# Patient Record
Sex: Female | Born: 1985 | Race: White | Hispanic: No | Marital: Married | Smoking: Never smoker
Health system: Southern US, Community
[De-identification: ages and names within clinical notes are randomized; demographics above are authoritative.]

## PROBLEM LIST (undated history)

## (undated) ENCOUNTER — Inpatient Hospital Stay (HOSPITAL_COMMUNITY): Payer: Self-pay

## (undated) DIAGNOSIS — Z8619 Personal history of other infectious and parasitic diseases: Secondary | ICD-10-CM

## (undated) DIAGNOSIS — O24419 Gestational diabetes mellitus in pregnancy, unspecified control: Secondary | ICD-10-CM

## (undated) HISTORY — DX: Personal history of other infectious and parasitic diseases: Z86.19

## (undated) HISTORY — PX: WISDOM TOOTH EXTRACTION: SHX21

## (undated) HISTORY — DX: Gestational diabetes mellitus in pregnancy, unspecified control: O24.419

---
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
## 2003-05-01 ENCOUNTER — Other Ambulatory Visit: Admission: RE | Admit: 2003-05-01 | Discharge: 2003-05-01 | Payer: Self-pay | Admitting: Gynecology

## 2009-05-02 ENCOUNTER — Emergency Department (HOSPITAL_BASED_OUTPATIENT_CLINIC_OR_DEPARTMENT_OTHER): Admission: EM | Admit: 2009-05-02 | Discharge: 2009-05-02 | Payer: Self-pay | Admitting: Emergency Medicine

## 2010-09-12 ENCOUNTER — Encounter: Payer: BC Managed Care – PPO | Attending: Gynecology | Admitting: Dietician

## 2010-09-12 DIAGNOSIS — Z713 Dietary counseling and surveillance: Secondary | ICD-10-CM | POA: Insufficient documentation

## 2010-09-12 DIAGNOSIS — O99810 Abnormal glucose complicating pregnancy: Secondary | ICD-10-CM | POA: Insufficient documentation

## 2010-11-08 ENCOUNTER — Inpatient Hospital Stay (HOSPITAL_COMMUNITY)
Admission: AD | Admit: 2010-11-08 | Discharge: 2010-11-10 | DRG: 372 | Disposition: A | Discharge status: Home / Self Care | Payer: BC Managed Care – PPO | Source: Ambulatory Visit | Attending: Obstetrics | Admitting: Obstetrics

## 2010-11-08 DIAGNOSIS — O99814 Abnormal glucose complicating childbirth: Secondary | ICD-10-CM | POA: Diagnosis present

## 2010-11-09 LAB — RPR: RPR Ser Ql: NONREACTIVE

## 2010-11-09 LAB — CBC
HCT: 36.4 % (ref 36.0–46.0)
Hemoglobin: 11.6 g/dL — ABNORMAL LOW (ref 12.0–15.0)
MCH: 27.3 pg (ref 26.0–34.0)
MCHC: 31.9 g/dL (ref 30.0–36.0)
MCV: 85.6 fL (ref 78.0–100.0)
Platelets: 225 10*3/uL (ref 150–400)
RBC: 4.25 MIL/uL (ref 3.87–5.11)
RDW: 16.3 % — ABNORMAL HIGH (ref 11.5–15.5)
WBC: 14 10*3/uL — ABNORMAL HIGH (ref 4.0–10.5)

## 2010-11-09 LAB — GLUCOSE, RANDOM: Glucose, Bld: 121 mg/dL — ABNORMAL HIGH (ref 70–99)

## 2010-11-10 LAB — CBC
HCT: 33.6 % — ABNORMAL LOW (ref 36.0–46.0)
Hemoglobin: 10.4 g/dL — ABNORMAL LOW (ref 12.0–15.0)
MCH: 26.6 pg (ref 26.0–34.0)
MCHC: 31 g/dL (ref 30.0–36.0)
MCV: 85.9 fL (ref 78.0–100.0)
Platelets: 234 10*3/uL (ref 150–400)
RBC: 3.91 MIL/uL (ref 3.87–5.11)
RDW: 16.4 % — ABNORMAL HIGH (ref 11.5–15.5)
WBC: 11.4 10*3/uL — ABNORMAL HIGH (ref 4.0–10.5)

## 2012-06-16 NOTE — L&D Delivery Note (Signed)
Delivery Note  First Stage: Labor onset: 0600 Augmentation : Foley bulb & AROM Analgesia /Anesthesia intrapartum: none; hydrotherapy AROM at 0652  Second Stage: In waterbirth tub: [phone]; water temp: 99.0 Complete dilation at 1012 Onset of pushing at 1012 FHR second stage 135  Delivery of a viable female at 1020 in water birth tub by CNM in LOT position NO nuchal cord Cord double clamped after cessation of pulsation, cut by FOB Cord blood sample collected    Third Stage: Out of waterbirth tub: 1025 Placenta delivered via spontaneous Schultz intact with 3 VC @ 1031 Placenta disposition: Birthing suites Uterine tone firm / bleeding moderate  2nd degree perineal laceration identified  Anesthesia for repair: Lidocaine Repair 3.0 vicryl, 4.0 vicryl Est. Blood Loss (mL): 450  Complications: none  Mom to postpartum.  Baby to immediate skin-to-skin bonding on mom's chest and then to FOB's chest for skin-to-skin bonding While transferring patient to bed for placental delivery  Newborn: Birth Weight: pending  Apgar Scores: 8/8 Feeding planned: breast; infant breastfeeding during perineal repair  *Dr. Cherly Hensen notified of uncomplicated waterbirth and perineal repair   [name], M  MSN, CNM 04/24/2013, 11:31 AM

## 2012-09-27 LAB — OB RESULTS CONSOLE ABO/RH: RH Type: POSITIVE

## 2012-09-27 LAB — OB RESULTS CONSOLE HIV ANTIBODY (ROUTINE TESTING): HIV: NONREACTIVE

## 2012-09-27 LAB — OB RESULTS CONSOLE RPR: RPR: NONREACTIVE

## 2012-09-27 LAB — OB RESULTS CONSOLE RUBELLA ANTIBODY, IGM: Rubella: IMMUNE

## 2013-03-24 LAB — OB RESULTS CONSOLE GBS: GBS: NEGATIVE

## 2013-04-20 ENCOUNTER — Telehealth (HOSPITAL_COMMUNITY): Payer: Self-pay | Admitting: *Deleted

## 2013-04-20 ENCOUNTER — Encounter (HOSPITAL_COMMUNITY): Payer: Self-pay | Admitting: *Deleted

## 2013-04-20 NOTE — Telephone Encounter (Signed)
Preadmission screen  

## 2013-04-23 ENCOUNTER — Encounter (HOSPITAL_COMMUNITY): Payer: Self-pay

## 2013-04-23 ENCOUNTER — Inpatient Hospital Stay (HOSPITAL_COMMUNITY)
Admission: RE | Admit: 2013-04-23 | Discharge: 2013-04-25 | DRG: 775 | Disposition: A | Discharge status: Home / Self Care | Payer: BC Managed Care – PPO | Source: Ambulatory Visit | Attending: Obstetrics and Gynecology | Admitting: Obstetrics and Gynecology

## 2013-04-23 DIAGNOSIS — O3660X0 Maternal care for excessive fetal growth, unspecified trimester, not applicable or unspecified: Secondary | ICD-10-CM | POA: Diagnosis present

## 2013-04-23 DIAGNOSIS — O99814 Abnormal glucose complicating childbirth: Principal | ICD-10-CM | POA: Diagnosis present

## 2013-04-23 LAB — CBC
HCT: 34.1 % — ABNORMAL LOW (ref 36.0–46.0)
MCH: 26 pg (ref 26.0–34.0)
MCV: 82 fL (ref 78.0–100.0)
Platelets: 272 10*3/uL (ref 150–400)
RDW: 15.9 % — ABNORMAL HIGH (ref 11.5–15.5)

## 2013-04-23 MED ORDER — IBUPROFEN 600 MG PO TABS: 600.0000 mg | ORAL_TABLET | Freq: Four times a day (QID) | ORAL | Status: DC | PRN

## 2013-04-23 MED ORDER — OXYCODONE-ACETAMINOPHEN 5-325 MG PO TABS: 1.0000 | ORAL_TABLET | ORAL | Status: DC | PRN

## 2013-04-23 MED ORDER — CITRIC ACID-SODIUM CITRATE 334-500 MG/5ML PO SOLN: 30.0000 mL | ORAL | Status: DC | PRN

## 2013-04-23 MED ORDER — OXYTOCIN 10 UNIT/ML IJ SOLN: 10.0000 [IU] | Freq: Once | INTRAMUSCULAR | Status: AC | PRN

## 2013-04-23 MED ORDER — ACETAMINOPHEN 325 MG PO TABS: 650.0000 mg | ORAL_TABLET | ORAL | Status: DC | PRN

## 2013-04-23 MED ORDER — LACTATED RINGERS IV SOLN: 500.0000 mL | INTRAVENOUS | Status: DC | PRN

## 2013-04-23 MED ORDER — LIDOCAINE HCL (PF) 1 % IJ SOLN
30.0000 mL | INTRAMUSCULAR | Status: DC | PRN
  Administered 2013-04-24: 30 mL via SUBCUTANEOUS
  Filled 2013-04-23 (×2): qty 30

## 2013-04-23 NOTE — H&P (Signed)
OB ADMISSION/ HISTORY & PHYSICAL:   Admission Date: 04/23/2013  7:25 PM  Admit Diagnosis: INDUCTION OF LABOR DUE TO GDM -A2 and low normal amniotic fluid  Bonnie Roth is a 27 y.o. female presenting for IOL @ 39.[redacted]wks gestation.  Prenatal History: [id number]   EDC : 04/26/2013, by Other Basis  Prenatal care at [facility] since 9.[redacted] weeks gestation Primary Care Provider at [facility]: Marlinda Mike, CNM  Prenatal course complicated by GDM  Prenatal Labs: ABO, Rh: A (04/14 0000)  Antibody: Negative (04/14 0000) Rubella: Immune (04/14 0000)  RPR: Nonreactive (04/14 0000)  HBsAg: Negative (04/14 0000)  HIV: Non-reactive (04/14 0000)  GBS: Negative (10/09 0000)  1 hr Glucola : omitted d/t HgB A1C: 5.7 mg/dL   Medical / Surgical History :  Past medical history:  Past Medical History  Diagnosis Date  . Gestational diabetes   . Hx of varicella      Past surgical history:  Past Surgical History  Procedure Laterality Date  . Arthroscopic knee surgery  2001    Left  . Wisdom tooth extraction       Family History:  Family History  Problem Relation Age of Onset  . Hypertension Mother   . Depression Mother   . Anemia Mother   . Bipolar disorder Brother   . Depression Brother   . Depression Maternal Grandmother   . Thyroid disease Paternal Grandmother      Social History:  has no tobacco, alcohol, and drug history on file.   Allergies: Review of patient's allergies indicates no known allergies.    Current Medications at time of admission:  Prescriptions prior to admission  Medication Sig Dispense Refill  . acetaminophen (TYLENOL) 325 MG tablet Take 650 mg by mouth every 6 (six) hours as needed.      . metFORMIN (GLUCOPHAGE) 1000 MG tablet Take 1,500 mg by mouth daily after supper. 500mg  daily after breakfast      . OVER THE COUNTER MEDICATION Take 1 tablet by mouth as needed (blue cohosh to help with labor).          Review of Systems: Review  of Systems  Constitutional: Negative.   HENT: Negative.   Eyes: Negative.   Respiratory: Negative.   Cardiovascular: Negative.   Gastrointestinal: Negative.   Genitourinary: Negative.   Musculoskeletal: Negative.   Skin: Negative.   Neurological: Negative.   Endo/Heme/Allergies: Negative.   Psychiatric/Behavioral: Negative.   (+) FM  Physical Exam:  Dilation: 2.5 Effacement (%): 80 Station: -2 Exam by:: R. Alder Murri,CNM  Filed Vitals:   04/23/13 2313  BP: 119/65  Pulse: 97  Temp: 98.7 F (37.1 C)  Resp: 20    General: A&O x3, NAD Heart: RRR, no murmurs Lungs: CTAB Abdomen: gravid, soft, non-tender Extremities: no swelling Genitalia / VE: normal genitalia, 2.5/80/-2/vtx FHR: 145, moderate variability, accels present, decels absent TOCO: 2-3  Labs:     Recent Labs  04/23/13 2000  WBC 12.1*  HGB 10.8*  HCT 34.1*  PLT 272     Assessment:  27 y.o. [id number] at [redacted]w[redacted]d  1. Labor: Early 2. Fetal Wellbeing: Category 1  3. Pain Control:  4. GBS: negative   Plan:  1. Admit to BS 2. Routine L&D orders 3. Analgesia/anesthesia PRN     Dr Cherly Hensen notified of admission / plan of care    Kenard Gower, MSN, CNM 04/23/2013, 11:02 PM

## 2013-04-23 NOTE — Progress Notes (Addendum)
Patient ID: Bonnie Roth, female   DOB: 08-11-1985, 27 y.o.   MRN: [id number] S: Doing well, pain minimal and well-controlled with frequent position changes and use of birthing ball. (+) pelvic pressure.   O: Filed Vitals:   04/23/13 1955 04/23/13 2004 04/23/13 2006  BP: 129/81    Pulse: 92    Temp:   98.1 F (36.7 C)  TempSrc:   Oral  Resp: 18    Height:  5\' 9"  (1.753 m)   Weight:  108.863 kg (240 lb)      FHT:  FHR: 145 bpm, variability: moderate,  accelerations:  Present,  decelerations:  Absent - intermittent  UC:   irregular, every 3-5 minutes by palpation SVE:   Dilation: 2.5 Effacement (%): 80 Station: -2 Exam by:: R. Loreda Silverio,CNM   A / P: Induction of labor due to gestational diabetes class A2 and low normal AFI,  progressing well on pitocin GDM A2 - stable on Metformin H/O LGA babies (EFW 8'9")  Fetal Wellbeing:  Category I Pain Control:  Labor support without medications  Anticipated MOD:  NSVD  Kenard Gower, MSN, CNM 04/23/2013, 10:59 PM

## 2013-04-23 NOTE — Progress Notes (Addendum)
Patient ID: Bonnie Roth, female   DOB: [birth year], 27 y.o.   MRN: [id number] Bonnie Roth is a 27 y.o. [id number] at [redacted]w[redacted]d by ultrasound admitted for induction of labor due to Low amniotic fluid. and Gestational diabetes.  Subjective:  Doing well with no complaints of pain.  Diffusing essential oils as labor adjunct.  Requesting to labor outside  Of tub and have birth take place in birthing tub.  Husband and sister here as support persons - husband will be the Nature conservation officer.   Objective: Filed Vitals:   04/23/13 1955 04/23/13 2004 04/23/13 2006  BP: 129/81    Pulse: 92    Temp:   98.1 F (36.7 C)  TempSrc:   Oral  Resp: 18    Height:  5\' 9"  (1.753 m)   Weight:  108.863 kg (240 lb)           FHT:  FHR: 145 bpm, variability: moderate,  accelerations:  Present,  decelerations:  Absent UC:   regular, every 2-3 minutes SVE:   Dilation: 2.5 Effacement (%): 80 Station: -2 Exam by:: R. Raffaele Derise,CNM  Labs:   Recent Labs  04/23/13 2000  WBC 12.1*  HGB 10.8*  HCT 34.1*  PLT 272    Assessment / Plan: Foley Bulb Induction of labor due to gestational diabetes class A2 and low normal AFI GDM A2 - stable on Metformin H/O LGA babies (EFW this pregnancy 8'9")  Labor: Early Labor Fetal Wellbeing:  Category I Pain Control:  Labor support without medications - with plans to use various positions for labor Anticipated MOD:  NSVD - anticipating waterbirth  * Dr. Cherly Hensen notified of plan of care - agrees with plan  Kenard Gower, MSN, CNM 04/23/2013, 8:35 PM

## 2013-04-24 ENCOUNTER — Encounter (HOSPITAL_COMMUNITY): Payer: Self-pay

## 2013-04-24 LAB — ABO/RH: ABO/RH(D): A POS

## 2013-04-24 LAB — TYPE AND SCREEN
ABO/RH(D): A POS
Antibody Screen: NEGATIVE

## 2013-04-24 LAB — GLUCOSE, CAPILLARY
Glucose-Capillary: 107 mg/dL — ABNORMAL HIGH (ref 70–99)
Glucose-Capillary: 110 mg/dL — ABNORMAL HIGH (ref 70–99)
Glucose-Capillary: 92 mg/dL (ref 70–99)

## 2013-04-24 MED ORDER — ONDANSETRON HCL 4 MG PO TABS: 4.0000 mg | ORAL_TABLET | ORAL | Status: DC | PRN

## 2013-04-24 MED ORDER — OXYTOCIN 10 UNIT/ML IJ SOLN
40.0000 [IU] | INTRAVENOUS | Status: DC
  Filled 2013-04-24: qty 4

## 2013-04-24 MED ORDER — TETANUS-DIPHTH-ACELL PERTUSSIS 5-2.5-18.5 LF-MCG/0.5 IM SUSP: 0.5000 mL | Freq: Once | INTRAMUSCULAR | Status: DC

## 2013-04-24 MED ORDER — WITCH HAZEL-GLYCERIN EX PADS: 1.0000 "application " | MEDICATED_PAD | CUTANEOUS | Status: DC | PRN

## 2013-04-24 MED ORDER — ONDANSETRON HCL 4 MG/2ML IJ SOLN: 4.0000 mg | INTRAMUSCULAR | Status: DC | PRN

## 2013-04-24 MED ORDER — DIPHENHYDRAMINE HCL 25 MG PO CAPS: 25.0000 mg | ORAL_CAPSULE | Freq: Four times a day (QID) | ORAL | Status: DC | PRN

## 2013-04-24 MED ORDER — DIBUCAINE 1 % RE OINT: 1.0000 "application " | TOPICAL_OINTMENT | RECTAL | Status: DC | PRN

## 2013-04-24 MED ORDER — SENNOSIDES-DOCUSATE SODIUM 8.6-50 MG PO TABS
2.0000 | ORAL_TABLET | ORAL | Status: DC
  Administered 2013-04-25: 2 via ORAL
  Filled 2013-04-24: qty 2

## 2013-04-24 MED ORDER — PRENATAL MULTIVITAMIN CH
1.0000 | ORAL_TABLET | Freq: Every day | ORAL | Status: DC
  Administered 2013-04-25: 1 via ORAL
  Filled 2013-04-24 (×2): qty 1

## 2013-04-24 MED ORDER — OXYTOCIN 40 UNITS IN LACTATED RINGERS INFUSION - SIMPLE MED
INTRAVENOUS | Status: AC
  Administered 2013-04-24: 40 [IU]
  Filled 2013-04-24: qty 1000

## 2013-04-24 MED ORDER — IBUPROFEN 600 MG PO TABS
600.0000 mg | ORAL_TABLET | Freq: Four times a day (QID) | ORAL | Status: DC
  Administered 2013-04-24 – 2013-04-25 (×4): 600 mg via ORAL
  Filled 2013-04-24 (×4): qty 1

## 2013-04-24 MED ORDER — OXYCODONE-ACETAMINOPHEN 5-325 MG PO TABS
1.0000 | ORAL_TABLET | ORAL | Status: DC | PRN
  Administered 2013-04-24 (×2): 1 via ORAL
  Filled 2013-04-24 (×2): qty 1

## 2013-04-24 MED ORDER — BENZOCAINE-MENTHOL 20-0.5 % EX AERO: 1.0000 "application " | INHALATION_SPRAY | CUTANEOUS | Status: DC | PRN

## 2013-04-24 MED ORDER — SIMETHICONE 80 MG PO CHEW: 80.0000 mg | CHEWABLE_TABLET | ORAL | Status: DC | PRN

## 2013-04-24 NOTE — Lactation Note (Signed)
This note was copied from the chart of Bonnie Terrianne Vink. Lactation Consultation Note Initial visit at 10 hours of age. [facility] LC resources given and discussed.  Mom reports knowing how to hand express.  Encouraged skin to skin feeding with cues. Basics reviewed.  Mom has previous breast feeding experience with her last child for 20 months. Mom denies problems or pain and reports breast feeding is going well with swallows heard.  Talked with parents about future pumping.  Mom to call for assist as needed.   Patient Name: Bonnie Roth [id number] Date: 04/24/2013 Reason for consult: Initial assessment   Maternal Data Formula Feeding for Exclusion: No Infant to breast within first hour of birth: Yes Has patient been taught Hand Expression?: Yes Does the patient have breastfeeding experience prior to this delivery?: Yes  Feeding Feeding Type: Breast Fed Length of feed: 15 min  LATCH Score/Interventions                      Lactation Tools Discussed/Used     Consult Status Consult Status: PRN    Jannifer Rodney 04/24/2013, 10:08 PM

## 2013-04-24 NOTE — Progress Notes (Signed)
Foley bulb out, monitors off per orders

## 2013-04-24 NOTE — Progress Notes (Signed)
Patient ID: Bonnie Roth, female   DOB: 1986/06/01, 27 y.o.   MRN: [id number]  S: Doing well, pain increasing and contractions closer together.  In water tub.  (+) pelvic pressure with every contraction.   OCeasar Mons Vitals:   04/24/13 0806 04/24/13 0904 04/24/13 1053 04/24/13 1103  BP:   117/69 131/72  Pulse:   110 117  Temp:      TempSrc:      Resp: 18 20    Height:      Weight:         FHT:  140, moderate variability, 10x10 accels, no decels UC:   regular, every 3-5 minutes SVE:   Dilation: 9 Effacement (%): 100 Station: +1 Exam by:: Arita Miss, CNM Time In tub/temp: 0835/100 degrees  A / P: Induction of labor due to gestational diabetes and low normal AFI,  progressing well  Fetal Wellbeing:  Category I Pain Control:  Labor support without medications and essential oils and hydrotherapy  Anticipated MOD:  NSVD - waterbirth  Kenard Gower, MSN, CNM 04/24/2013, 9:00 AM

## 2013-04-24 NOTE — Progress Notes (Signed)
Patient ID: Bonnie Roth, female   DOB: [birth year], 27 y.o.   MRN: [id number] S: Doing well, contractions were spaced out and now getting closer together, but tolerable.  OCeasar Mons Vitals:   04/23/13 2004 04/23/13 2006 04/23/13 2313 04/24/13 0200  BP:   119/65 127/80  Pulse:   97 103  Temp:  98.1 F (36.7 C) 98.7 F (37.1 C)   TempSrc:  Oral Oral   Resp:   20   Height: 5\' 9"  (1.753 m)     Weight: 108.863 kg (240 lb)        FHT:  FHR: 130 bpm, variability: moderate,  accelerations:  Present,  decelerations:  Absent UC:   irregular, every 7-10 minutes SVE:   Dilation: 5 Effacement (%): 80 Station: -1 Exam by:: R. Lorrine Killilea,CNM   A / P: Induction of labor due to gestational diabetes class A2 and low normal AFI GDM A2 - stable on Metformin H/O LGA babies (EFW 8'9")  Fetal Wellbeing:  Category I Pain Control:  Labor support without medications, aromatherapy  Anticipated MOD:  NSVD - planning a waterbirth  *Dr. Cherly Hensen notified of patient's progress and plan of care   Abdulkarim Eberlin, M, MSN, CNM 04/24/2013, 4:35 AM

## 2013-04-24 NOTE — Progress Notes (Signed)
Monitors off per orders 

## 2013-04-24 NOTE — Progress Notes (Signed)
Patient ID: Bonnie Roth, female   DOB: 09-22-1985, 27 y.o.   MRN: [id number]  Notified by RN of foley bulb came out and UC's are 3-5 mins  S: Doing well, pain well-controlled with breathing techniques, birthing ball, and aromatherapy. (+) pelvic pressure.   O: Filed Vitals:   04/23/13 1955 04/23/13 2004 04/23/13 2006 04/23/13 2313  BP: 129/81   119/65  Pulse: 92   97  Temp:   98.1 F (36.7 C) 98.7 F (37.1 C)  TempSrc:   Oral Oral  Resp: 18   20  Height:  5\' 9"  (1.753 m)    Weight:  108.863 kg (240 lb)       FHT:  FHR: 130 bpm, variability: moderate,  accelerations:  Present,  decelerations:  Absent UC:   irregular, every 3-5 minutes SVE:   Dilation: 5 Effacement (%): 80 Station: -1 Exam by:: R. Danaya Geddis,CNM   A / P: Induction of labor due to gestational diabetes and low normal AFI,  progressing well on with foley bulb GDM A2 - stable on Metformin H/O LGA babies (EFW this pregnancy 8'9")  Offer AROM for labor augmentation - declines at this time, would like to try other measures Fetal Wellbeing:  Category I Pain Control:  Labor support without medications  Anticipated MOD:  NSVD  Kenard Gower, MSN, CNM 04/24/2013, 12:56 AM

## 2013-04-24 NOTE — Progress Notes (Signed)
Patient ID: Bonnie Roth, female   DOB: 27-Mar-1986, 27 y.o.   MRN: [id number]  Call from RN stating patient is requesting to be checked again and she is considering AROM.  S: Doing well, pain tolerable, contractions getting closer.   OCeasar Mons Vitals:   04/23/13 2006 04/23/13 2313 04/24/13 0200 04/24/13 0540  BP:  119/65 127/80 124/72  Pulse:  97 103 82  Temp: 98.1 F (36.7 C) 98.7 F (37.1 C)  97.8 F (36.6 C)  TempSrc: Oral Oral  Oral  Resp:  20  18  Height:      Weight:         FHT:  FHR: 140 bpm, variability: moderate,  accelerations:  Present,  decelerations:  Absent UC:   regular, every 4-5 minutes SVE:   Dilation: 6 Effacement (%): 80 Station: -1 Exam by:: R. Kammi Hechler,CNM AROM with small amount of clear fluid  A / P: Induction of labor due to gestational diabetes and low normal AFI AROM GDM class A2- stable on Metformin H/O LGA babies (EFW 8'9")  Fetal Wellbeing:  Category I Pain Control:  Labor support without medications  Anticipated MOD:  NSVD, planning waterbirth  [name], M, MSN, CNM 04/24/2013, 7:00 AM

## 2013-04-25 LAB — CBC
HCT: 27.7 % — ABNORMAL LOW (ref 36.0–46.0)
Hemoglobin: 8.9 g/dL — ABNORMAL LOW (ref 12.0–15.0)
MCHC: 32.1 g/dL (ref 30.0–36.0)
MCV: 81 fL (ref 78.0–100.0)
Platelets: 245 10*3/uL (ref 150–400)
RDW: 15.7 % — ABNORMAL HIGH (ref 11.5–15.5)
WBC: 12.2 10*3/uL — ABNORMAL HIGH (ref 4.0–10.5)

## 2013-04-25 MED ORDER — POLYSACCHARIDE IRON COMPLEX 150 MG PO CAPS: 150.0000 mg | ORAL_CAPSULE | Freq: Every day | ORAL | Status: DC

## 2013-04-25 MED ORDER — OXYCODONE-ACETAMINOPHEN 5-325 MG PO TABS: 1.0000 | ORAL_TABLET | Freq: Four times a day (QID) | ORAL | Status: DC | PRN

## 2013-04-25 MED ORDER — DOCUSATE SODIUM 100 MG PO CAPS
100.0000 mg | ORAL_CAPSULE | Freq: Two times a day (BID) | ORAL | Status: AC | PRN
Start: 2013-04-25 — End: 2014-04-25

## 2013-04-25 MED ORDER — IBUPROFEN 600 MG PO TABS: 600.0000 mg | ORAL_TABLET | Freq: Four times a day (QID) | ORAL | Status: DC

## 2013-04-25 MED ORDER — POLYSACCHARIDE IRON COMPLEX 150 MG PO CAPS
150.0000 mg | ORAL_CAPSULE | Freq: Every day | ORAL | Status: DC
  Filled 2013-04-25: qty 1

## 2013-04-25 NOTE — Progress Notes (Addendum)
Patient ID: Bonnie Roth, female   DOB: 10-30-1985, 27 y.o.   MRN: [id number] PPD # 1  Subjective: Pt reports feeling well and eager for early d/c home/ Pain controlled with ibuprofen Tolerating po/ Voiding without problems/ No n/v Bleeding is light/ Newborn info:  Information for the patient's newborn:  Antonea, Gaut [[id number]]  female  / circ not planned/ Feeding: breast    Objective:  VS: Blood pressure 104/60, pulse 87, temperature 98.5 F (36.9 C), temperature source Oral, resp. rate 16.    Recent Labs  04/23/13 2000 04/25/13 0555  WBC 12.1* 12.2*  HGB 10.8* 8.9*  HCT 34.1* 27.7*  PLT 272 245    Blood type:  A POS  Rubella: Immune (04/14 0000)    Physical Exam:  General:  alert, cooperative and no distress CV: Regular rate and rhythm Resp: clear Abdomen: soft, nontender, normal bowel sounds Uterine Fundus: firm, below umbilicus, nontender Perineum: healing with good reapproximation; small amt edema; no evidence of hematoma Lochia: moderate Ext: edema trace and Homans sign is negative, no sign of DVT    A/P: PPD # 1/ G4P3013/ S/P: SVD w/2nd deg lac with repair ABL Anemia Hx GDM (A2); stable; urged to cont low carb diet and monitor occasional FBS Doing well and stable for discharge home RX: Ibuprofen 600mg  po Q 6 hrs prn pain #30 Refill x 1 Niferex 150mg  po QD/BID #30/#60 Refill x 1 Percocet 5/325 1 to 2 po Q 4 hrs prn pain #15 No refill Colace 100mg  po BID prn #30 Ref x 1 WOB/GYN booklet given Routine pp visit in 6wks   Demetrius Revel, MSN, [place] 04/25/2013, 10:12 AM

## 2013-04-27 NOTE — Discharge Summary (Signed)
Obstetric Discharge Summary Reason for Admission: G4 P2 0 1 2 @ 39w 4d for IOL d/t GDM (A2) Prenatal Procedures: NST and ultrasound Intrapartum Procedures: spontaneous vaginal delivery Postpartum Procedures: none Complications-Operative and Postpartum: 2nd degree perineal laceration Hemoglobin  Date Value Range Status  04/25/2013 8.9* 12.0 - 15.0 g/dL Final     REPEATED TO VERIFY     DELTA CHECK NOTED     HCT  Date Value Range Status  04/25/2013 27.7* 36.0 - 46.0 % Final    Physical Exam:  General: alert, cooperative and no distress Lochia: appropriate Uterine Fundus: firm Incision: n/a DVT Evaluation: No evidence of DVT seen on physical exam. Negative Homan's sign.  Discharge Diagnoses: G4 P3 0 1 3 s/p SVD with 2nd lac with repair; ABL Anemia, CLass A2 GDM  Discharge Information: Date: 04/27/2013 Activity: pelvic rest Diet: routine Medications: PNV, Ibuprofen, Colace, Iron and Percocet Condition: stable Instructions: refer to practice specific booklet Discharge to: home Follow-up Information   Follow up with DAWSON, ROLITTA, M, CNM In 6 weeks.   Specialty:  Obstetrics and Gynecology   Contact information:   [address] [place] [address] [phone]       Follow up with [name], M, CNM In 6 weeks.   Specialty:  Obstetrics and Gynecology   Contact information:   [address] [place] [address] [phone]       Newborn Data: Live born female on 04/24/13 Birth Weight: 10 lb (4535 g) APGAR: 8, 8  Home with mother.  Demetrius Revel, MSN, [facility] 04/25/13 12:00

## 2014-04-17 ENCOUNTER — Encounter (HOSPITAL_COMMUNITY): Payer: Self-pay

## 2015-02-08 ENCOUNTER — Encounter (HOSPITAL_COMMUNITY): Payer: Self-pay | Admitting: *Deleted

## 2015-02-08 ENCOUNTER — Inpatient Hospital Stay (HOSPITAL_COMMUNITY)
Admission: AD | Admit: 2015-02-08 | Discharge: 2015-02-08 | Disposition: A | Discharge status: Home / Self Care | Payer: BLUE CROSS/BLUE SHIELD | Source: Ambulatory Visit | Attending: Family Medicine | Admitting: Family Medicine

## 2015-02-08 DIAGNOSIS — Z3A18 18 weeks gestation of pregnancy: Secondary | ICD-10-CM | POA: Diagnosis not present

## 2015-02-08 DIAGNOSIS — Z8249 Family history of ischemic heart disease and other diseases of the circulatory system: Secondary | ICD-10-CM | POA: Diagnosis not present

## 2015-02-08 DIAGNOSIS — O9989 Other specified diseases and conditions complicating pregnancy, childbirth and the puerperium: Secondary | ICD-10-CM | POA: Insufficient documentation

## 2015-02-08 DIAGNOSIS — R197 Diarrhea, unspecified: Secondary | ICD-10-CM | POA: Insufficient documentation

## 2015-02-08 LAB — URINALYSIS, ROUTINE W REFLEX MICROSCOPIC
BILIRUBIN URINE: NEGATIVE
Glucose, UA: NEGATIVE mg/dL
KETONES UR: 15 mg/dL — AB
NITRITE: NEGATIVE
PROTEIN: NEGATIVE mg/dL
Specific Gravity, Urine: 1.015 (ref 1.005–1.030)
Urobilinogen, UA: 2 mg/dL — ABNORMAL HIGH (ref 0.0–1.0)
pH: 6 (ref 5.0–8.0)

## 2015-02-08 LAB — CBC WITH DIFFERENTIAL/PLATELET
BASOS ABS: 0 10*3/uL (ref 0.0–0.1)
BASOS PCT: 0 % (ref 0–1)
Eosinophils Absolute: 0.1 10*3/uL (ref 0.0–0.7)
Eosinophils Relative: 2 % (ref 0–5)
HCT: 38.6 % (ref 36.0–46.0)
HEMOGLOBIN: 13 g/dL (ref 12.0–15.0)
Lymphocytes Relative: 20 % (ref 12–46)
Lymphs Abs: 1 10*3/uL (ref 0.7–4.0)
MCH: 29 pg (ref 26.0–34.0)
MCHC: 33.7 g/dL (ref 30.0–36.0)
MCV: 86 fL (ref 78.0–100.0)
Monocytes Absolute: 0.4 10*3/uL (ref 0.1–1.0)
Monocytes Relative: 7 % (ref 3–12)
NEUTROS ABS: 3.7 10*3/uL (ref 1.7–7.7)
NEUTROS PCT: 71 % (ref 43–77)
Platelets: 214 10*3/uL (ref 150–400)
RBC: 4.49 MIL/uL (ref 3.87–5.11)
RDW: 14.8 % (ref 11.5–15.5)
WBC: 5.2 10*3/uL (ref 4.0–10.5)

## 2015-02-08 LAB — URINE MICROSCOPIC-ADD ON

## 2015-02-08 MED ORDER — LACTATED RINGERS IV BOLUS (SEPSIS)
1000.0000 mL | Freq: Once | INTRAVENOUS | Status: AC
  Administered 2015-02-08: 1000 mL via INTRAVENOUS

## 2015-02-08 NOTE — Discharge Instructions (Signed)

## 2015-02-08 NOTE — MAU Note (Signed)
Onset of diarrhea, N/V abdominal pain since Sunday, 6 episodes today.

## 2015-02-08 NOTE — MAU Provider Note (Signed)
History     CSN: [id number]  Arrival date and time: 02/08/15 1225   First Provider Initiated Contact with Patient 02/08/15 1301      Chief Complaint  Patient presents with  . Diarrhea  . Emesis  . Abdominal Pain  . Fever   HPI Bonnie Roth 29 y.o. [id number]  presents to MAU.  Diarrhea Sunday night (8/21).  Monday with nausea/vomiting.  Tuesday sweating/shaking/shivering.  Still sweating.  Still with diarrhea constantly.  Was told if diarrhea was continuing, she needed to come in for fluids.  Urine output has not been as frequent.  Diarrhea 6-7 times today.  It seems cyclical.  No vomiting since the one time yesterday morning.  Tylenol is not helpful.  Has been eating toast, rice, plain noodles, broth.   Everyone else in house has had same symptoms but they have all resolved by now.  Pt unsure if feeling fetal movement yet or not. OB History    Gravida Para Term Preterm AB TAB SAB Ectopic Multiple Living   Past Medical History  Diagnosis Date  . Gestational diabetes   . Hx of varicella   . Normal labor 04/24/2013  . Postpartum care following vaginal delivery (11/9) 04/24/2013    Past Surgical History  Procedure Laterality Date  . Arthroscopic knee surgery  2001    Left  . Wisdom tooth extraction      Family History  Problem Relation Age of Onset  . Hypertension Mother   . Depression Mother   . Anemia Mother   . Bipolar disorder Brother   . Depression Brother   . Depression Maternal Grandmother   . Thyroid disease Paternal Grandmother     Social History  Substance Use Topics  . Smoking status: Never Smoker   . Smokeless tobacco: None  . Alcohol Use: None     Comment: none during pregnancy    Allergies: No Known Allergies  Prescriptions prior to admission  Medication Sig Dispense Refill Last Dose  . acetaminophen (TYLENOL) 325 MG tablet Take 650 mg by mouth every 6 (six) hours as needed.   04/23/2013 at Unknown time  . ibuprofen  (ADVIL,MOTRIN) 600 MG tablet Take 1 tablet (600 mg total) by mouth every 6 (six) hours. 30 tablet 1   . iron polysaccharides (NIFEREX) 150 MG capsule Take 1 capsule (150 mg total) by mouth daily. 30 capsule 2   . OVER THE COUNTER MEDICATION Take 1 tablet by mouth as needed (blue cohosh to help with labor).   04/22/2013 at Unknown time  . oxyCODONE-acetaminophen (PERCOCET/ROXICET) 5-325 MG per tablet Take 1-2 tablets by mouth every 6 (six) hours as needed for severe pain (moderate - severe pain). 20 tablet 0     ROS Pertinent ROS in HPI.  All other systems are negative.   Physical Exam   Blood pressure 121/64, pulse 96, temperature 98.2 F (36.8 C), temperature source Oral, resp. rate 16, height  (1.727 m), weight 221 lb 12.8 oz (100.608 kg), unknown if currently breastfeeding.  Physical Exam  Constitutional: She is oriented to person, place, and time. She appears well-developed and well-nourished. No distress.  HENT:  Head: Normocephalic and atraumatic.  Eyes: EOM are normal.  Neck: Normal range of motion.  Cardiovascular: Normal rate.   Respiratory: Effort normal. No respiratory distress.  Musculoskeletal: Normal range of motion.  Neurological: She is alert and oriented to person, place, and time.  Skin: Skin is warm and dry.  Psychiatric: She has a normal mood and affect.    MAU Course  Procedures  MDM  IVFluids ordered while urine pending.  Pt initially unable to give urine sample.   CBC ordered Results for orders placed or performed during the hospital encounter of 02/08/15 (from the past 72 hour(s))  Urinalysis, Routine w reflex microscopic (not at [facility])     Status: Abnormal   Collection Time: 02/08/15 12:35 PM  Result Value Ref Range   Color, Urine YELLOW YELLOW   APPearance CLEAR CLEAR   Specific Gravity, Urine 1.015 1.005 - 1.030   pH 6.0 5.0 - 8.0   Glucose, UA NEGATIVE NEGATIVE mg/dL   Hgb urine dipstick SMALL (A) NEGATIVE   Bilirubin Urine NEGATIVE NEGATIVE    Ketones, ur 15 (A) NEGATIVE mg/dL   Protein, ur NEGATIVE NEGATIVE mg/dL   Urobilinogen, UA 2.0 (H) 0.0 - 1.0 mg/dL   Nitrite NEGATIVE NEGATIVE   Leukocytes, UA TRACE (A) NEGATIVE  Urine microscopic-add on     Status: Abnormal   Collection Time: 02/08/15 12:35 PM  Result Value Ref Range   Squamous Epithelial / LPF MANY (A) RARE   WBC, UA 3-6 <3 WBC/hpf   RBC / HPF 3-6 <3 RBC/hpf   Bacteria, UA MANY (A) RARE  CBC with Differential/Platelet     Status: None   Collection Time: 02/08/15  1:57 PM  Result Value Ref Range   WBC 5.2 4.0 - 10.5 K/uL   RBC 4.49 3.87 - 5.11 MIL/uL   Hemoglobin 13.0 12.0 - 15.0 g/dL   HCT 16.1 09.6 - 04.5 %   MCV 86.0 78.0 - 100.0 fL   MCH 29.0 26.0 - 34.0 pg   MCHC 33.7 30.0 - 36.0 g/dL   RDW 40.9 81.1 - 91.4 %   Platelets 214 150 - 400 K/uL   Neutrophils Relative % 71 43 - 77 %   Neutro Abs 3.7 1.7 - 7.7 K/uL   Lymphocytes Relative 20 12 - 46 %   Lymphs Abs 1.0 0.7 - 4.0 K/uL   Monocytes Relative 7 3 - 12 %   Monocytes Absolute 0.4 0.1 - 1.0 K/uL   Eosinophils Relative 2 0 - 5 %   Eosinophils Absolute 0.1 0.0 - 0.7 K/uL   Basophils Relative 0 0 - 1 %   Basophils Absolute 0.0 0.0 - 0.1 K/uL  No definitive sign of infection or other emergent condition Feeling improved with fluids  Assessment and Plan  A: diarrhea in pregnancy.    P: Discharge to home Push PO hydration F/u with OB as scheduled/as needed OB urine culture pending Patient may return to MAU as needed or if her condition were to change or worsen     Bertram Denver 02/08/2015, 1:05 PM

## 2015-02-26 LAB — OB RESULTS CONSOLE GC/CHLAMYDIA
CHLAMYDIA, DNA PROBE: NEGATIVE
Gonorrhea: NEGATIVE

## 2015-02-26 LAB — OB RESULTS CONSOLE RUBELLA ANTIBODY, IGM: RUBELLA: IMMUNE

## 2015-02-26 LAB — OB RESULTS CONSOLE HIV ANTIBODY (ROUTINE TESTING): HIV: NONREACTIVE

## 2015-02-26 LAB — OB RESULTS CONSOLE HEPATITIS B SURFACE ANTIGEN: HEP B S AG: NEGATIVE

## 2015-02-26 LAB — OB RESULTS CONSOLE RPR: RPR: NONREACTIVE

## 2015-03-10 ENCOUNTER — Encounter (HOSPITAL_COMMUNITY): Payer: Self-pay | Admitting: *Deleted

## 2015-03-10 ENCOUNTER — Inpatient Hospital Stay (HOSPITAL_COMMUNITY)
Admission: AD | Admit: 2015-03-10 | Discharge: 2015-03-11 | Disposition: A | Discharge status: Home / Self Care | Payer: BLUE CROSS/BLUE SHIELD | Source: Ambulatory Visit | Attending: Obstetrics and Gynecology | Admitting: Obstetrics and Gynecology

## 2015-03-10 DIAGNOSIS — Z3A23 23 weeks gestation of pregnancy: Secondary | ICD-10-CM | POA: Insufficient documentation

## 2015-03-10 DIAGNOSIS — R109 Unspecified abdominal pain: Secondary | ICD-10-CM | POA: Insufficient documentation

## 2015-03-10 DIAGNOSIS — R1013 Epigastric pain: Secondary | ICD-10-CM | POA: Diagnosis present

## 2015-03-10 DIAGNOSIS — O26892 Other specified pregnancy related conditions, second trimester: Secondary | ICD-10-CM | POA: Insufficient documentation

## 2015-03-10 LAB — COMPREHENSIVE METABOLIC PANEL
ALT: 20 U/L (ref 14–54)
AST: 22 U/L (ref 15–41)
Albumin: 3.7 g/dL (ref 3.5–5.0)
Alkaline Phosphatase: 65 U/L (ref 38–126)
Anion gap: 6 (ref 5–15)
BUN: 9 mg/dL (ref 6–20)
CO2: 24 mmol/L (ref 22–32)
Calcium: 9.1 mg/dL (ref 8.9–10.3)
Chloride: 105 mmol/L (ref 101–111)
Creatinine, Ser: 0.59 mg/dL (ref 0.44–1.00)
GFR calc Af Amer: >60 mL/min (ref >60)
GFR calc non Af Amer: >60 mL/min (ref >60)
Glucose, Bld: 100 mg/dL — ABNORMAL HIGH (ref 65–99)
Potassium: 3.8 mmol/L (ref 3.5–5.1)
Sodium: 135 mmol/L (ref 135–145)
Total Bilirubin: 1 mg/dL (ref 0.3–1.2)
Total Protein: 6.9 g/dL (ref 6.5–8.1)

## 2015-03-10 LAB — LIPASE, BLOOD: Lipase: 38 U/L (ref 22–51)

## 2015-03-10 LAB — CBC
HCT: 35.8 % — ABNORMAL LOW (ref 36.0–46.0)
Hemoglobin: 11.8 g/dL — ABNORMAL LOW (ref 12.0–15.0)
MCH: 29 pg (ref 26.0–34.0)
MCHC: 33 g/dL (ref 30.0–36.0)
MCV: 88 fL (ref 78.0–100.0)
Platelets: 224 10*3/uL (ref 150–400)
RBC: 4.07 MIL/uL (ref 3.87–5.11)
RDW: 14.9 % (ref 11.5–15.5)
WBC: 10.2 10*3/uL (ref 4.0–10.5)

## 2015-03-10 LAB — AMYLASE: Amylase: 75 U/L (ref 28–100)

## 2015-03-10 MED ORDER — NALBUPHINE HCL 10 MG/ML IJ SOLN
10.0000 mg | INTRAMUSCULAR | Status: AC
  Administered 2015-03-10: 10 mg via SUBCUTANEOUS
  Filled 2015-03-10: qty 1

## 2015-03-10 MED ORDER — HYDROXYZINE HCL 25 MG PO TABS
25.0000 mg | ORAL_TABLET | ORAL | Status: AC
  Administered 2015-03-10: 25 mg via ORAL
  Filled 2015-03-10: qty 1

## 2015-03-10 MED ORDER — GI COCKTAIL ~~LOC~~
30.0000 mL | Freq: Once | ORAL | Status: AC
  Administered 2015-03-10: 30 mL via ORAL
  Filled 2015-03-10: qty 30

## 2015-03-10 MED ORDER — GI COCKTAIL ~~LOC~~
30.0000 mL | ORAL | Status: AC
  Administered 2015-03-10: 30 mL via ORAL
  Filled 2015-03-10: qty 30

## 2015-03-10 NOTE — MAU Provider Note (Signed)
History     CSN: [id number]  Arrival date and time: 03/10/15 2151 Call to provider @ 2207 - orders given to nurse Provider to here to see patient @ 2245    Chief Complaint  Patient presents with  . Abdominal Pain   HPI Epigastric pain after eating - reports as severe nausea   Past Medical History  Diagnosis Date  . Gestational diabetes   . Hx of varicella   . Normal labor 04/24/2013  . Postpartum care following vaginal delivery (11/9) 04/24/2013    Past Surgical History  Procedure Laterality Date  . Arthroscopic knee surgery  2001    Left  . Wisdom tooth extraction      Family History  Problem Relation Age of Onset  . Hypertension Mother   . Depression Mother   . Anemia Mother   . Bipolar disorder Brother   . Depression Brother   . Depression Maternal Grandmother   . Thyroid disease Paternal Grandmother     Social History  Substance Use Topics  . Smoking status: Never Smoker   . Smokeless tobacco: None  . Alcohol Use: None     Comment: none during pregnancy    Allergies: No Known Allergies  Prescriptions prior to admission  Medication Sig Dispense Refill Last Dose  . acetaminophen (TYLENOL) 325 MG tablet Take 650 mg by mouth every 6 (six) hours as needed for mild pain or headache.    Past Month at Unknown time  . diphenhydrAMINE (BENADRYL) 25 MG tablet Take 25 mg by mouth at bedtime as needed for sleep.   03/09/2015 at Unknown time  . Multiple Vitamin (MULTIVITAMIN WITH MINERALS) TABS tablet Take 1 tablet by mouth daily.   03/09/2015 at Unknown time   ROS  Sudden onset epigastric pain - radiating thru to back Onset 2 hr after meal (green beans / BBQ ribs/ potatoes) Progressively worse despite position changes / heating pad / shower (+) nausea / no vomiting No SOB   Physical Exam  Alert and oriented / intense discomfort - tearful sitting up in bed tense & gripping bed sheets Heart RRR Lungs  Clear Abdomen soft / + BS / non-distended & non-tender on  exam FHR 135 with EFM - no ctx Deferred pelvic exam  Blood pressure 122/77, pulse 101, temperature 98.4 F (36.9 C), temperature source Oral, resp. rate 18, unknown if currently breastfeeding.  Physical Exam Results for Bonnie Roth, Bonnie Roth (MRN [id number]) as of 03/10/2015 22:49  Ref. Range 03/10/2015 22:15  WBC Latest Ref Range: 4.0-10.5 K/uL 10.2  Hemoglobin Latest Ref Range: 12.0-15.0 g/dL 28.4 (L)  HCT Latest Ref Range: 36.0-46.0 % 35.8 (L)  Platelets Latest Ref Range: 150-400 K/uL 224   Results for Bonnie Roth, Bonnie Roth [facility] (MRN [id number]) as of 03/10/2015 23:16  Ref. Range 03/10/2015 22:15 03/10/2015 22:21  Sodium Latest Ref Range: 135-145 mmol/L 135   Potassium Latest Ref Range: 3.5-5.1 mmol/L 3.8   CO2 Latest Ref Range: 22-32 mmol/L 24   BUN Latest Ref Range: 6-20 mg/dL 9   Creatinine Latest Ref Range: 0.44-1.00 mg/dL 7.25   Glucose Latest Ref Range: 65-99 mg/dL 366 (H)   Alkaline Phosphatase Latest Ref Range: 38-126 U/L 65   Albumin Latest Ref Range: 3.5-5.0 g/dL 3.7   Amylase Latest Ref Range: 28-100 U/L 75   Lipase Latest Ref Range: 22-51 U/L  38  Bonnie Roth Latest Ref Range: 15-41 U/L 22   ALT Latest Ref Range: 14-54 U/L 20    MAU Course  Procedures  Assessment and Plan  23.0 weeks pregnancy Acute epigastric pain with nausea Pain consistent with acute gallbladder episode - no evidence of cholecystitis (afebrile with normal WBC & exam) / most likely cholelithiasis etiology  1) GI cocktail x 2 to reduce gastric symptoms / nausea 2) CBC / CMP / amylase/ lipase - liver function studies within normal range 3) Nubain sub-Q for pain and vistaril  PO now  Pain significant relieved with analgesia and NPO status Recommend NPO until AM and start with fluids then bland diet - maintain low fat x 1 week Office will schedule outpatient upper GI sono to evaluate for gallstones next week Call if symptoms significantly worsen DC home  Marlinda Mike 03/10/2015, 10:46 PM

## 2015-03-10 NOTE — MAU Note (Signed)
Pt c/o sharp severe epigastric pain that stared right after dinner.  Though it was gas but has gotten worse. Feeling a little nauseated as well.

## 2015-06-08 ENCOUNTER — Encounter (HOSPITAL_BASED_OUTPATIENT_CLINIC_OR_DEPARTMENT_OTHER): Payer: Self-pay | Admitting: *Deleted

## 2015-06-08 ENCOUNTER — Emergency Department (HOSPITAL_BASED_OUTPATIENT_CLINIC_OR_DEPARTMENT_OTHER)
Admission: EM | Admit: 2015-06-08 | Discharge: 2015-06-09 | Disposition: A | Discharge status: Home / Self Care | Payer: BLUE CROSS/BLUE SHIELD | Attending: Emergency Medicine | Admitting: Emergency Medicine

## 2015-06-08 DIAGNOSIS — R42 Dizziness and giddiness: Secondary | ICD-10-CM | POA: Insufficient documentation

## 2015-06-08 DIAGNOSIS — R51 Headache: Secondary | ICD-10-CM | POA: Diagnosis not present

## 2015-06-08 DIAGNOSIS — Z8619 Personal history of other infectious and parasitic diseases: Secondary | ICD-10-CM | POA: Diagnosis not present

## 2015-06-08 DIAGNOSIS — O9989 Other specified diseases and conditions complicating pregnancy, childbirth and the puerperium: Secondary | ICD-10-CM | POA: Diagnosis not present

## 2015-06-08 DIAGNOSIS — R0981 Nasal congestion: Secondary | ICD-10-CM | POA: Diagnosis not present

## 2015-06-08 DIAGNOSIS — Z79899 Other long term (current) drug therapy: Secondary | ICD-10-CM | POA: Diagnosis not present

## 2015-06-08 DIAGNOSIS — Z8632 Personal history of gestational diabetes: Secondary | ICD-10-CM | POA: Insufficient documentation

## 2015-06-08 DIAGNOSIS — Z3A35 35 weeks gestation of pregnancy: Secondary | ICD-10-CM | POA: Insufficient documentation

## 2015-06-08 DIAGNOSIS — R519 Headache, unspecified: Secondary | ICD-10-CM

## 2015-06-08 NOTE — ED Notes (Addendum)
Pt c/o URI symptoms x 1 week 32 weeks

## 2015-06-08 NOTE — ED Provider Notes (Signed)
CSN: [id number]     Arrival date & time 06/08/15  2204 History  By signing my name below, I, Soijett Blue, attest that this documentation has been prepared under the direction and in the presence of Shon Baton, MD. Electronically Signed: Soijett Blue, ED Scribe. 06/08/2015. 11:55 PM.   Chief Complaint  Patient presents with  . URI      The history is provided by the patient. No language interpreter was used.    Bonnie Roth is a 28 y.o. female who presents to the Emergency Department complaining of 9/10 sinus pressure onset 2 days. She notes that she has a hx of sinus infections and she tried to get in with her Midwives to be seen with no relief. She reports that she is currently [redacted] weeks pregnant and that thsi is her 4th pregnancy. She states that she is having associated symptoms of rhinorrhea, congestion, facial pain, HA and "room spinning" dizziness with eye closed. She states that she has tried benadryl, tylenol, and neti-pot with no relief for her symptoms. Reports a total of 2 weeks of symptoms including congestion with worsening symptoms over the last 2 days. She's been unable to sleep secondary to the pressure. She denies fever, SOB, cough, sore throat, abdominal pain, loss of fluids, and any other symptoms.  Denies allergies to medications.   Past Medical History  Diagnosis Date  . Gestational diabetes   . Hx of varicella   . Normal labor 04/24/2013  . Postpartum care following vaginal delivery (11/9) 04/24/2013   Past Surgical History  Procedure Laterality Date  . Arthroscopic knee surgery  2001    Left  . Wisdom tooth extraction     Family History  Problem Relation Age of Onset  . Hypertension Mother   . Depression Mother   . Anemia Mother   . Bipolar disorder Brother   . Depression Brother   . Depression Maternal Grandmother   . Thyroid disease Paternal Grandmother    Social History  Substance Use Topics  . Smoking status: Never Smoker   . Smokeless  tobacco: None  . Alcohol Use: None     Comment: none during pregnancy   OB History    Gravida Para Term Preterm AB TAB SAB Ectopic Multiple Living   Review of Systems  Constitutional: Negative for fever.  HENT: Positive for congestion and sinus pressure.        Facial pain  Respiratory: Negative for cough and shortness of breath.   Cardiovascular: Negative for chest pain.  Gastrointestinal: Negative for nausea, vomiting and abdominal pain.  Genitourinary: Negative for vaginal bleeding.  Neurological: Positive for dizziness and headaches.  All other systems reviewed and are negative.     Allergies  Review of patient's allergies indicates no known allergies.  Home Medications   Prior to Admission medications   Medication Sig Start Date End Date Taking? Authorizing Provider  acetaminophen (TYLENOL) 325 MG tablet Take 650 mg by mouth every 6 (six) hours as needed for mild pain or headache.     Historical Provider, MD  amoxicillin-clavulanate (AUGMENTIN) 875-125 MG tablet Take 1 tablet by mouth 2 (two) times daily. 06/09/15   Shon Baton, MD  diphenhydrAMINE (BENADRYL) 25 MG tablet Take 25 mg by mouth at bedtime as needed for sleep.    Historical Provider, MD  HYDROcodone-acetaminophen (NORCO/VICODIN) 5-325 MG tablet Take 1 tablet by mouth every 6 (six) hours as  needed for moderate pain. 06/09/15   Shon Batonourtney F Horton, MD  Multiple Vitamin (MULTIVITAMIN WITH MINERALS) TABS tablet Take 1 tablet by mouth daily.    Historical Provider, MD   BP 130/85 mmHg  Pulse 96  Temp(Src) 98.3 F (36.8 C) (Oral)  Resp 18  Ht 5\' 8"  (1.727 m)  Wt 232 lb (105.235 kg)  BMI 35.28 kg/m2  SpO2 99% Physical Exam  Constitutional: She is oriented to person, place, and time. She appears well-developed and well-nourished. No distress.  HENT:  Head: Normocephalic and atraumatic.  Right Ear: External ear normal.  Left Ear: External ear normal.  Nose: Nose normal.   Mouth/Throat: Oropharynx is clear and moist.  Tenderness palpation over the maxillary and frontal sinuses  Eyes: Pupils are equal, round, and reactive to light.  Neck: Normal range of motion. Neck supple.  Cardiovascular: Normal rate, regular rhythm and normal heart sounds.   No murmur heard. Pulmonary/Chest: Effort normal and breath sounds normal. No respiratory distress. She has no wheezes.  Abdominal: Soft. Bowel sounds are normal. There is no tenderness. There is no rebound.  Gravid  Neurological: She is alert and oriented to person, place, and time.  Skin: Skin is warm and dry.  Psychiatric: She has a normal mood and affect.  Nursing note and vitals reviewed.   ED Course  Procedures (including critical care time) DIAGNOSTIC STUDIES: Oxygen Saturation is 99% on RA, nl by my interpretation.    COORDINATION OF CARE: 11:54 PM Discussed treatment plan with pt at bedside which includes continue neti-pot, steroid Rx, abx Rx, and pt agreed to plan.    Labs Review Labs Reviewed - No data to display  Imaging Review No results found.    EKG Interpretation None      MDM   Final diagnoses:  Sinus headache    Patient presents with progressively worsening sinus pressure, headache. Reports 2 weeks of symptoms with acute worsening over the last 2 days. She is currently pregnant. She is using multiple supportive measures at home without relief. She is nontoxic and afebrile. Exam is largely reassuring. Given that she is pregnant, limited treatments are available. Pain is likely related to the sinus pressure. She was given 1 dose of pain medication in the ER. Additionally, given the duration of symptoms we'll place on antibiotic as she has a history of sinus infections and has limited other supportive measures. Discussed with patient a short course of pain medication at home for pain as well as an antibiotic and continued supportive care home. Patient stated understanding.  After  history, exam, and medical workup I feel the patient has been appropriately medically screened and is safe for discharge home. Pertinent diagnoses were discussed with the patient. Patient was given return precautions.  I personally performed the services described in this documentation, which was scribed in my presence. The recorded information has been reviewed and is accurate.    Shon Batonourtney F Horton, MD 06/09/15 [phone]

## 2015-06-08 NOTE — ED Notes (Signed)
Pt c/o sinus pain and pressure since last night, hx of a lot of sinus infections and states this feels similar.  Has tried benadryl, tylenol and neti pot without a lot of relief, denies fevers, is [redacted] weeks pregnant.

## 2015-06-09 MED ORDER — AMOXICILLIN-POT CLAVULANATE 875-125 MG PO TABS: 1.0000 | ORAL_TABLET | Freq: Two times a day (BID) | ORAL | Status: DC

## 2015-06-09 MED ORDER — HYDROCODONE-ACETAMINOPHEN 5-325 MG PO TABS
1.0000 | ORAL_TABLET | Freq: Once | ORAL | Status: AC
  Administered 2015-06-09: 1 via ORAL
  Filled 2015-06-09: qty 1

## 2015-06-09 MED ORDER — HYDROCODONE-ACETAMINOPHEN 5-325 MG PO TABS: 1.0000 | ORAL_TABLET | Freq: Four times a day (QID) | ORAL | Status: DC | PRN

## 2015-06-09 MED ORDER — FLUTICASONE PROPIONATE 50 MCG/ACT NA SUSP
1.0000 | Freq: Every day | NASAL | Status: DC
  Filled 2015-06-09: qty 16

## 2015-06-09 MED ORDER — AMOXICILLIN-POT CLAVULANATE 875-125 MG PO TABS
1.0000 | ORAL_TABLET | Freq: Once | ORAL | Status: AC
  Administered 2015-06-09: 1 via ORAL
  Filled 2015-06-09: qty 1

## 2015-06-09 NOTE — Discharge Instructions (Signed)
Sinus Headache A sinus headache occurs when the paranasal sinuses become clogged or swollen. Paranasal sinuses are air pockets within the bones of the face. Sinus headaches can range from mild to severe. CAUSES A sinus headache can result from various conditions that affect the sinuses, such as:  Colds.  Sinus infections.  Allergies. SYMPTOMS The main symptom of this condition is a headache that may feel like pain or pressure in the face, forehead, ears, or upper teeth. People who have a sinus headache often have other symptoms, such as:  Congested or runny nose.  Fever.  Inability to smell. Weather changes can make symptoms worse. DIAGNOSIS This condition may be diagnosed based on:  A physical exam and medical history.  Imaging tests, such as a CT scan and MRI, to check for problems with the sinuses.  A specialist may look into the sinuses with a tool that has a camera (endoscopy). TREATMENT Treatment for this condition depends on the cause.  Sinus pain that is caused by a sinus infection may be treated with antibiotic medicine.  Sinus pain that is caused by allergies may be helped by allergy medicines (antihistamines) and medicated nasal sprays.  Sinus pain that is caused by congestion may be helped by flushing the nose and sinuses with saline solution. HOME CARE INSTRUCTIONS  Take medicines only as directed by your health care provider.  If you were prescribed an antibiotic medicine, finish all of it even if you start to feel better.  If you have congestion, use a nasal spray to help reduce pressure.  If directed, apply a warm, moist washcloth to your face to help relieve pain. SEEK MEDICAL CARE IF:  You have headaches more than one time each week.  You have sensitivity to light or sound.  You have a fever.  You feel sick to your stomach (nauseous) or you throw up (vomit).  Your headaches do not get better with treatment. Many people think that they have a  sinus headache when they actually have migraines or tension headaches. SEEK IMMEDIATE MEDICAL CARE IF:  You have vision problems.  You have sudden, severe pain in your face or head.  You have a seizure.  You are confused.  You have a stiff neck.   This information is not intended to replace advice given to you by your health care provider. Make sure you discuss any questions you have with your health care provider.   Document Released: 07/10/2004 Document Revised: 10/17/2014 Document Reviewed: 05/29/2014 Elsevier Interactive Patient Education 2016 [employer]  

## 2015-06-17 NOTE — L&D Delivery Note (Signed)
Delivery Note  First Stage: Labor onset: 1530 Augmentation : none Analgesia /Anesthesia intrapartum: none AROM at [employer] labor - water temp 100.1  Second Stage: Complete dilation at 2038 Onset of pushing at 2038 FHR second stage doppler 125 - 130  Delivery of a viable female at 2041 by CNM in ROA position - water birth no nuchal cord Cord double clamped after cessation of pulsation, cut by FOB Cord blood sample collected   Third Stage: Placenta delivered [facility] intact with 3 VC @ 2052 Placenta disposition: hospital disposal Uterine tone firm / bleeding small  Pitocin 10 unit IM and cytotec 800 mcg per rectum prophylaxis  1st degree laceration identified  - superficial but entire length of perineum  / no vaginal extension / no muscle involvement Anesthesia for repair: 1% xylocaine local Repair 4-0 vicryl subcuticular Est. Blood Loss (mL): 100  Complications: none  Mom to postpartum.  Baby to Couplet care / Skin to Skin.  Newborn: Birth Weight: 9 pounds and 1.7 ounces  Apgar Scores: 8-9 Feeding planned: breast  Marlinda Mike CNM, MSN, FACNM 07/05/2015, 9:15 PM

## 2015-07-05 ENCOUNTER — Encounter (HOSPITAL_COMMUNITY): Payer: Self-pay | Admitting: *Deleted

## 2015-07-05 ENCOUNTER — Inpatient Hospital Stay (HOSPITAL_COMMUNITY)
Admission: AD | Admit: 2015-07-05 | Discharge: 2015-07-07 | DRG: 775 | Disposition: A | Discharge status: Home / Self Care | Payer: BLUE CROSS/BLUE SHIELD | Source: Ambulatory Visit | Attending: Obstetrics and Gynecology | Admitting: Obstetrics and Gynecology

## 2015-07-05 DIAGNOSIS — O3663X0 Maternal care for excessive fetal growth, third trimester, not applicable or unspecified: Secondary | ICD-10-CM | POA: Diagnosis present

## 2015-07-05 DIAGNOSIS — O24429 Gestational diabetes mellitus in childbirth, unspecified control: Secondary | ICD-10-CM | POA: Diagnosis present

## 2015-07-05 DIAGNOSIS — Z8249 Family history of ischemic heart disease and other diseases of the circulatory system: Secondary | ICD-10-CM | POA: Diagnosis not present

## 2015-07-05 DIAGNOSIS — R1013 Epigastric pain: Secondary | ICD-10-CM

## 2015-07-05 DIAGNOSIS — Z3A39 39 weeks gestation of pregnancy: Secondary | ICD-10-CM | POA: Diagnosis not present

## 2015-07-05 LAB — CBC
HCT: 34.1 % — ABNORMAL LOW (ref 36.0–46.0)
Hemoglobin: 11 g/dL — ABNORMAL LOW (ref 12.0–15.0)
MCH: 27.1 pg (ref 26.0–34.0)
MCHC: 32.3 g/dL (ref 30.0–36.0)
MCV: 84 fL (ref 78.0–100.0)
Platelets: 219 10*3/uL (ref 150–400)
RBC: 4.06 MIL/uL (ref 3.87–5.11)
RDW: 16.1 % — ABNORMAL HIGH (ref 11.5–15.5)
WBC: 15.9 10*3/uL — ABNORMAL HIGH (ref 4.0–10.5)

## 2015-07-05 LAB — OB RESULTS CONSOLE HEPATITIS B SURFACE ANTIGEN: Hepatitis B Surface Ag: NEGATIVE

## 2015-07-05 LAB — OB RESULTS CONSOLE ANTIBODY SCREEN: ANTIBODY SCREEN: NEGATIVE

## 2015-07-05 LAB — OB RESULTS CONSOLE RPR: RPR: NONREACTIVE

## 2015-07-05 LAB — OB RESULTS CONSOLE ABO/RH: RH Type: POSITIVE

## 2015-07-05 LAB — OB RESULTS CONSOLE HIV ANTIBODY (ROUTINE TESTING): HIV: NONREACTIVE

## 2015-07-05 MED ORDER — DIBUCAINE 1 % RE OINT: 1.0000 "application " | TOPICAL_OINTMENT | RECTAL | Status: DC | PRN

## 2015-07-05 MED ORDER — OXYCODONE HCL 5 MG PO TABS: 10.0000 mg | ORAL_TABLET | ORAL | Status: DC | PRN

## 2015-07-05 MED ORDER — MISOPROSTOL 200 MCG PO TABS
800.0000 ug | ORAL_TABLET | Freq: Once | ORAL | Status: AC
  Administered 2015-07-05: 800 ug via RECTAL
  Filled 2015-07-05: qty 4

## 2015-07-05 MED ORDER — LANOLIN HYDROUS EX OINT: TOPICAL_OINTMENT | CUTANEOUS | Status: DC | PRN

## 2015-07-05 MED ORDER — ACETAMINOPHEN 325 MG PO TABS: 650.0000 mg | ORAL_TABLET | ORAL | Status: DC | PRN

## 2015-07-05 MED ORDER — OXYCODONE-ACETAMINOPHEN 5-325 MG PO TABS
2.0000 | ORAL_TABLET | ORAL | Status: DC | PRN
  Administered 2015-07-05: 2 via ORAL
  Filled 2015-07-05: qty 2

## 2015-07-05 MED ORDER — LIDOCAINE HCL (PF) 1 % IJ SOLN
30.0000 mL | INTRAMUSCULAR | Status: DC | PRN
  Administered 2015-07-05: 30 mL via SUBCUTANEOUS
  Filled 2015-07-05: qty 30

## 2015-07-05 MED ORDER — OXYTOCIN 10 UNIT/ML IJ SOLN
10.0000 [IU] | Freq: Once | INTRAMUSCULAR | Status: DC
  Filled 2015-07-05: qty 1

## 2015-07-05 MED ORDER — WITCH HAZEL-GLYCERIN EX PADS: 1.0000 "application " | MEDICATED_PAD | CUTANEOUS | Status: DC | PRN

## 2015-07-05 MED ORDER — ACETAMINOPHEN 325 MG PO TABS
650.0000 mg | ORAL_TABLET | ORAL | Status: DC | PRN
Start: 2015-07-05 — End: 2015-07-05

## 2015-07-05 MED ORDER — LACTATED RINGERS IV SOLN: 500.0000 mL | INTRAVENOUS | Status: DC | PRN

## 2015-07-05 MED ORDER — BENZOCAINE-MENTHOL 20-0.5 % EX AERO: 1.0000 "application " | INHALATION_SPRAY | CUTANEOUS | Status: DC | PRN

## 2015-07-05 MED ORDER — CITRIC ACID-SODIUM CITRATE 334-500 MG/5ML PO SOLN: 30.0000 mL | ORAL | Status: DC | PRN

## 2015-07-05 MED ORDER — IBUPROFEN 600 MG PO TABS
600.0000 mg | ORAL_TABLET | Freq: Four times a day (QID) | ORAL | Status: DC
  Administered 2015-07-05 – 2015-07-07 (×6): 600 mg via ORAL
  Filled 2015-07-05 (×6): qty 1

## 2015-07-05 MED ORDER — SIMETHICONE 80 MG PO CHEW: 80.0000 mg | CHEWABLE_TABLET | ORAL | Status: DC | PRN

## 2015-07-05 MED ORDER — OXYCODONE-ACETAMINOPHEN 5-325 MG PO TABS: 1.0000 | ORAL_TABLET | ORAL | Status: DC | PRN

## 2015-07-05 MED ORDER — SENNOSIDES-DOCUSATE SODIUM 8.6-50 MG PO TABS
2.0000 | ORAL_TABLET | ORAL | Status: DC
  Administered 2015-07-05 – 2015-07-06 (×2): 2 via ORAL
  Filled 2015-07-05 (×2): qty 2

## 2015-07-05 MED ORDER — OXYCODONE HCL 5 MG PO TABS
5.0000 mg | ORAL_TABLET | ORAL | Status: DC | PRN
  Administered 2015-07-06 – 2015-07-07 (×5): 5 mg via ORAL
  Filled 2015-07-05 (×5): qty 1

## 2015-07-05 NOTE — MAU Note (Signed)
Membranes were stripped in the office yesterday.  In for labor eval.

## 2015-07-05 NOTE — H&P (Signed)
  OB ADMISSION/ HISTORY & PHYSICAL:  Admission Date: 07/05/2015  5:40 PM  Admit Diagnosis: 39.5 weeks / onset of labor  Bonnie Roth is a 30 y.o. female presenting for onset of labor.  Prenatal History: [id number]   EDC : 07/07/2015, by Other Basis  Prenatal care at [facility]  Primary Ob Provider: [name] CNM Prenatal course complicated by hx GDMa2 / macrosomia   Prenatal Labs: ABO, Rh:  A positive Antibody:  negative Rubella:   immune RPR:   NR HBsAg:   negative HIV:  NR  GTT: passed 3hr GTT GBS:   negative  Medical / Surgical History :  Past medical history:  Past Medical History  Diagnosis Date  . Gestational diabetes   . Hx of varicella   . Normal labor 04/24/2013  . Postpartum care following vaginal delivery (11/9) 04/24/2013     Past surgical history:  Past Surgical History  Procedure Laterality Date  . Arthroscopic knee surgery  2001    Left  . Wisdom tooth extraction      Family History:  Family History  Problem Relation Age of Onset  . Hypertension Mother   . Depression Mother   . Anemia Mother   . Bipolar disorder Brother   . Depression Brother   . Depression Maternal Grandmother   . Thyroid disease Paternal Grandmother      Social History:  reports that she has never smoked. She does not have any smokeless tobacco history on file. She reports that she does not use illicit drugs. Her alcohol history is not on file.   Allergies: Review of patient's allergies indicates no known allergies.    Current Medications at time of admission:  Prior to Admission medications   Medication Sig Start Date End Date Taking? Authorizing Provider  acetaminophen (TYLENOL) 325 MG tablet Take 650 mg by mouth every 6 (six) hours as needed for mild pain or headache.    Yes Historical Provider, MD  diphenhydrAMINE (BENADRYL) 25 MG tablet Take 25 mg by mouth at bedtime as needed for sleep.   Yes Historical Provider, MD  EVENING PRIMROSE OIL PO  Take 1 capsule by mouth 2 (two) times daily. Place 1 capsule vaginally at bedtime and then take one capsule by mouth in the morning.   Yes Historical Provider, MD  Multiple Vitamin (MULTIVITAMIN WITH MINERALS) TABS tablet Take 1 tablet by mouth daily.   Yes Historical Provider, MD   Review of Systems: Active FM onset of ctx every 5 minutes No LOF bloody show present   Physical Exam:  VS: Blood pressure 133/76, pulse 112, temperature 97.6 F (36.4 C), temperature source Oral, resp. rate 18, unknown if currently breastfeeding.  General: alert and oriented, appears uncomfortable with ctx Heart: RRR Lungs: Clear lung fields Abdomen: Gravid, soft and non-tender, non-distended / uterus: gravid Extremities: no edema  Genitalia / VE: Dilation: 5.5 Effacement (%): 90 Station: -2 Exam by:: Thressa Sheller, RN  FHR: baseline rate 135 / variability moderate / accelerations + / no decelerations TOCO: Q3-5 minutes  Assessment: 39.[redacted] weeks gestation active stage of labor FHR category 1   Plan:  Admit Expectant management Water immersion  Dr Billy Coast notified of admission   Marlinda Mike CNM, MSN, [facility] 07/05/2015, 6:21 PM

## 2015-07-06 LAB — RPR: RPR Ser Ql: NONREACTIVE

## 2015-07-06 MED ORDER — OXYCODONE HCL 5 MG PO TABS: 5.0000 mg | ORAL_TABLET | ORAL | Status: DC | PRN

## 2015-07-06 MED ORDER — IBUPROFEN 600 MG PO TABS: 600.0000 mg | ORAL_TABLET | Freq: Four times a day (QID) | ORAL | Status: DC

## 2015-07-06 NOTE — Progress Notes (Addendum)
PPD #1- SVD  Subjective:   Reports feeling well, desires discharge later today Tolerating po/ No nausea or vomiting Bleeding is light Pain controlled with Motrin and Percocet Up ad lib / ambulatory / voiding without problems Newborn: breastfeeding  / Circumcision: not planning  Objective:   VS: VS:  Filed Vitals:   07/05/15 2201 07/05/15 2244 07/05/15 2345 07/06/15 0545  BP: 113/62 131/74 131/74 127/70  Pulse: 106 88 75 76  Temp:  98.6 F (37 C) 98.6 F (37 C) 98.2 F (36.8 C)  TempSrc:  Oral Oral Oral  Resp:  Height:      Weight:        LABS:  Recent Labs  07/05/15 2250  WBC 15.9*  HGB 11.0*  PLT 219   Blood type: A/Positive/-- (01/19 0000) Rubella: Immune (09/12 0000)                I&O: Intake/Output      01/19 0701 - 01/20 0700 01/20 0701 - 01/21 0700   Blood 50    Total Output 50     Net -50            Physical Exam: Alert and oriented X3 Abdomen: soft, non-tender, non-distended  Fundus: firm, non-tender, U-2 Perineum: Well approximated, no significant erythema, edema, or drainage; healing well. Lochia: small Extremities: No edema, no calf pain or tenderness   Assessment: PPD #1  [id number]/ S/P:spontaneous vaginal Doing well - stable for discharge home  Plan: Discharge home pending newborn discharge RX's:  Ibuprofen  po Q 6 hrs prn pain #30 Refill x 0 Oxycodone  1-2 po q4 hrs prn #20, no refill Follow up in 6 wks at [facility] for postpartum visit [facility] booklet given    Donette Larry, N MSN, CNM 07/06/2015, 11:27 AM

## 2015-07-06 NOTE — Lactation Note (Signed)
This note was copied from the chart of Bonnie Earnstine Faires. Lactation Consultation Note Experienced BF mom BF her now 30 yr old for 2 yrs, BF her now 30 yr old for 2 yrs, BF her now 61 yr old for 2 yrs. Mom would BF up close to time to have another baby. She has only stopped BF for 4 months this last pregnancy. States this baby is BF well w/o painful latches. Mom encouraged to feed baby 8-12 times/24 hours and with feeding cues. Mom states she hand easily hand express colostrum. Educated about newborn behavior, I&O, cluster feeding, supply and demand. WH/LC brochure given w/resources, support groups and LC services. Patient Name: Bonnie Roth [id number] Date: 07/06/2015 Reason for consult: Initial assessment   Maternal Data Has patient been taught Hand Expression?: Yes Does the patient have breastfeeding experience prior to this delivery?: Yes  Feeding Feeding Type: Breast Fed Length of feed: 20 min  LATCH Score/Interventions Latch: Grasps breast easily, tongue down, lips flanged, rhythmical sucking.  Audible Swallowing: Spontaneous and intermittent  Type of Nipple: Everted at rest and after stimulation  Comfort (Breast/Nipple): Soft / non-tender     Hold (Positioning): No assistance needed to correctly position infant at breast.  LATCH Score: 10  Lactation Tools Discussed/Used     Consult Status Consult Status: PRN Date: 07/07/15 Follow-up type: In-patient    Charyl Dancer 07/06/2015, 4:19 AM

## 2015-07-06 NOTE — Lactation Note (Signed)
This note was copied from the chart of Bonnie Roth. Lactation Consultation Note  Patient Name: Bonnie Roth [id number] Date: 07/06/2015 Reason for consult: Follow-up assessment Baby at 20 hr of life and mom reports feeding are going well. She is an experienced bf mom that plans to go home tonight. Denies breast or nipple pain, no concerns voiced. Discussed baby behavior, feeding frequency, baby belly size, voids, wt loss, breast changes, and nipple care. She is aware of OP services and support group.    Maternal Data    Feeding Feeding Type: Breast Fed Length of feed: 30 min  LATCH Score/Interventions                      Lactation Tools Discussed/Used     Consult Status Consult Status: PRN    Rulon Eisenmenger 07/06/2015, 5:01 PM

## 2015-07-06 NOTE — Discharge Summary (Deleted)
Obstetric Discharge Summary Reason for Admission: onset of labor and [redacted] weeks gestation Prenatal Course: complicated by hx of A2GDM and macrosomia in previous pregnancy Intrapartum Procedures: spontaneous vaginal delivery and AROM-clear, waterbirth Postpartum Procedures: none Complications-Operative and Postpartum: 1st degree perineal laceration HEMOGLOBIN  Date Value Ref Range Status  07/05/2015 11.0* 12.0 - 15.0 g/dL Final   HCT  Date Value Ref Range Status  07/05/2015 34.1* 36.0 - 46.0 % Final    Physical Exam:  General: alert, cooperative and no distress Lochia: appropriate Uterine Fundus: firm Incision: healing well, no significant drainage, no dehiscence, no significant erythema DVT Evaluation: No evidence of DVT seen on physical exam. Negative Homan's sign. No cords or calf tenderness. No significant calf/ankle edema.  Discharge Diagnoses: Term Pregnancy-delivered  Discharge Information: Date: 07/06/2015 Activity: pelvic rest Diet: routine Medications: PNV, Ibuprofen and Oxycodone Condition: stable Instructions: refer to practice specific booklet Discharge to: home Follow-up Information    Follow up with Bonnie Roth, M, CNM. Schedule an appointment as soon as possible for a visit in 6 weeks.   Specialty:  Obstetrics and Gynecology   Contact information:   [address] [place] [address] [phone]       Newborn Data: Live born female on 07/05/15 Birth Weight: 9 lb 1.7 oz (4130 g) APGAR: 8, 9  Home with mother.  Bonnie Roth, N 07/06/2015, 11:32 AM

## 2015-07-06 NOTE — Progress Notes (Signed)
Discontinued discharge order per phone order with CNM Donette Larry. Mom no longer wanted a early discharge since baby was not going to be discharged.

## 2015-07-07 NOTE — Discharge Summary (Signed)
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 Negative Homan's sign. No cords or calf tenderness. No significant calf/ankle edema.  Discharge Diagnoses: Term Pregnancy-delivered  Discharge Information: Date: 07/07/2015 Activity: pelvic rest Diet: routine Medications: PNV, Ibuprofen and Percocet Condition: stable Instructions: refer to practice specific booklet Discharge to: home Follow-up Information    Follow up with [name], M, CNM. Schedule an appointment as soon as possible for a visit in 6 weeks.   Specialty: Obstetrics and Gynecology   Contact information:   [address] [place] [address] [phone]       Newborn Data: Live born female on [birth year] Birth Weight: 9 lb 1.7 oz (4130 g) APGAR: 8, 9  Home with mother.      [name], M MSN, CNM 07/07/2015 8:08 AM

## 2015-07-07 NOTE — Discharge Instructions (Signed)
Postpartum Depression and Baby Blues °The postpartum period begins right after the birth of a baby. During this time, there is often a great amount of joy and excitement. It is also a time of many changes in the life of the parents. Regardless of how many times a mother gives birth, each child brings new challenges and dynamics to the family. It is not unusual to have feelings of excitement along with confusing shifts in moods, emotions, and thoughts. All mothers are at risk of developing postpartum depression or the "baby blues." These mood changes can occur right after giving birth, or they may occur many months after giving birth. The baby blues or postpartum depression can be mild or severe. Additionally, postpartum depression can go away rather quickly, or it can be a long-term condition.  °CAUSES °Raised hormone levels and the rapid drop in those levels are thought to be a main cause of postpartum depression and the baby blues. A number of hormones change during and after pregnancy. Estrogen and progesterone usually decrease right after the delivery of your baby. The levels of thyroid hormone and various cortisol steroids also rapidly drop. Other factors that play a role in these mood changes include major life events and genetics.  °RISK FACTORS °If you have any of the following risks for the baby blues or postpartum depression, know what symptoms to watch out for during the postpartum period. Risk factors that may increase the likelihood of getting the baby blues or postpartum depression include: °· Having a personal or family history of depression.   °· Having depression while being pregnant.   °· Having premenstrual mood issues or mood issues related to oral contraceptives. °· Having a lot of life stress.   °· Having marital conflict.   °· Lacking a social support network.   °· Having a baby with special needs.   °· Having health problems, such as diabetes.   °SIGNS AND SYMPTOMS °Symptoms of baby blues  include: °· Brief changes in mood, such as going from extreme happiness to sadness. °· Decreased concentration.   °· Difficulty sleeping.   °· Crying spells, tearfulness.   °· Irritability.   °· Anxiety.   °Symptoms of postpartum depression typically begin within the first month after giving birth. These symptoms include: °· Difficulty sleeping or excessive sleepiness.   °· Marked weight loss.   °· Agitation.   °· Feelings of worthlessness.   °· Lack of interest in activity or food.   °Postpartum psychosis is a very serious condition and can be dangerous. Fortunately, it is rare. Displaying any of the following symptoms is cause for immediate medical attention. Symptoms of postpartum psychosis include:  °· Hallucinations and delusions.   °· Bizarre or disorganized behavior.   °· Confusion or disorientation.   °DIAGNOSIS  °A diagnosis is made by an evaluation of your symptoms. There are no medical or lab tests that lead to a diagnosis, but there are various questionnaires that a health care provider may use to identify those with the baby blues, postpartum depression, or psychosis. Often, a screening tool called the Edinburgh Postnatal Depression Scale is used to diagnose depression in the postpartum period.  °TREATMENT °The baby blues usually goes away on its own in 1-2 weeks. Social support is often all that is needed. You will be encouraged to get adequate sleep and rest. Occasionally, you may be given medicines to help you sleep.  °Postpartum depression requires treatment because it can last several months or longer if it is not treated. Treatment may include individual or group therapy, medicine, or both to address any social, physiological, and psychological   factors that may play a role in the depression. Regular exercise, a healthy diet, rest, and social support may also be strongly recommended.  °Postpartum psychosis is more serious and needs treatment right away. Hospitalization is often needed. °HOME CARE  INSTRUCTIONS °· Get as much rest as you can. Nap when the baby sleeps.   °· Exercise regularly. Some women find yoga and walking to be beneficial.   °· Eat a balanced and nourishing diet.   °· Do little things that you enjoy. Have a cup of tea, take a bubble bath, read your favorite magazine, or listen to your favorite music. °· Avoid alcohol.   °· Ask for help with household chores, cooking, grocery shopping, or running errands as needed. Do not try to do everything.   °· Talk to people close to you about how you are feeling. Get support from your partner, family members, friends, or other new moms. °· Try to stay positive in how you think. Think about the things you are grateful for.   °· Do not spend a lot of time alone.   °· Only take over-the-counter or prescription medicine as directed by your health care provider. °· Keep all your postpartum appointments.   °· Let your health care provider know if you have any concerns.   °SEEK MEDICAL CARE IF: °You are having a reaction to or problems with your medicine. °SEEK IMMEDIATE MEDICAL CARE IF: °· You have suicidal feelings.   °· You think you may harm the baby or someone else. °MAKE SURE YOU: °· Understand these instructions. °· Will watch your condition. °· Will get help right away if you are not doing well or get worse. °  °This information is not intended to replace advice given to you by your health care provider. Make sure you discuss any questions you have with your health care provider. °  °Document Released: 03/06/2004 Document Revised: 06/07/2013 Document Reviewed: 03/14/2013 °Elsevier Interactive Patient Education ©2016 [employer] °Postpartum Care After Vaginal Delivery °After you deliver your newborn (postpartum period), the usual stay in the hospital is 24-72 hours. If there were problems with your labor or delivery, or if you have other medical problems, you might be in the hospital longer.  °While you are in the hospital, you will receive help and  instructions on how to care for yourself and your newborn during the postpartum period.  °While you are in the hospital: °· Be sure to tell your nurses if you have pain or discomfort, as well as where you feel the pain and what makes the pain worse. °· If you had an incision made near your vagina (episiotomy) or if you had some tearing during delivery, the nurses may put ice packs on your episiotomy or tear. The ice packs may help to reduce the pain and swelling. °· If you are breastfeeding, you may feel uncomfortable contractions of your uterus for a couple of weeks. This is normal. The contractions help your uterus get back to normal size. °· It is normal to have some bleeding after delivery. °· For the first 1-3 days after delivery, the flow is red and the amount may be similar to a period. °· It is common for the flow to start and stop. °· In the first few days, you may pass some small clots. Let your nurses know if you begin to pass large clots or your flow increases. °· Do not  flush blood clots down the toilet before having the nurse look at them. °· During the next 3-10 days after delivery, your   flow should become more watery and pink or brown-tinged in color. °· Ten to fourteen days after delivery, your flow should be a small amount of yellowish-white discharge. °· The amount of your flow will decrease over the first few weeks after delivery. Your flow may stop in 6-8 weeks. Most women have had their flow stop by 12 weeks after delivery. °· You should change your sanitary pads frequently. °· Wash your hands thoroughly with soap and water for at least 20 seconds after changing pads, using the toilet, or before holding or feeding your newborn. °· You should feel like you need to empty your bladder within the first 6-8 hours after delivery. °· In case you become weak, lightheaded, or faint, call your nurse before you get out of bed for the first time and before you take a shower for the first time. °· Within  the first few days after delivery, your breasts may begin to feel tender and full. This is called engorgement. Breast tenderness usually goes away within 48-72 hours after engorgement occurs. You may also notice milk leaking from your breasts. If you are not breastfeeding, do not stimulate your breasts. Breast stimulation can make your breasts produce more milk. °· Spending as much time as possible with your newborn is very important. During this time, you and your newborn can feel close and get to know each other. Having your newborn stay in your room (rooming in) will help to strengthen the bond with your newborn.  It will give you time to get to know your newborn and become comfortable caring for your newborn. °· Your hormones change after delivery. Sometimes the hormone changes can temporarily cause you to feel sad or tearful. These feelings should not last more than a few days. If these feelings last longer than that, you should talk to your caregiver. °· If desired, talk to your caregiver about methods of family planning or contraception. °· Talk to your caregiver about immunizations. Your caregiver may want you to have the following immunizations before leaving the hospital: °· Tetanus, diphtheria, and pertussis (Tdap) or tetanus and diphtheria (Td) immunization. It is very important that you and your family (including grandparents) or others caring for your newborn are up-to-date with the Tdap or Td immunizations. The Tdap or Td immunization can help protect your newborn from getting ill. °· Rubella immunization. °· Varicella (chickenpox) immunization. °· Influenza immunization. You should receive this annual immunization if you did not receive the immunization during your pregnancy. °  °This information is not intended to replace advice given to you by your health care provider. Make sure you discuss any questions you have with your health care provider. °  °Document Released: 03/30/2007 Document Revised:  02/25/2012 Document Reviewed: 01/28/2012 °[employer]vier Interactive Patient Education ©2016 [employer] °Breastfeeding and Mastitis °Mastitis is inflammation of the breast tissue. It can occur in women who are breastfeeding. This can make breastfeeding painful. Mastitis will sometimes go away on its own. Your health care provider will help determine if treatment is needed. °CAUSES °Mastitis is often associated with a blocked milk (lactiferous) duct. This can happen when too much milk builds up in the breast. Causes of excess milk in the breast can include: °· Poor latch-on. If your baby is not latched onto the breast properly, she or he may not empty your breast completely while breastfeeding. °· Allowing too much time to pass between feedings. °· Wearing a bra or other clothing that is too tight. This puts extra pressure on the   lactiferous ducts so milk does not flow through them as it should. °Mastitis can also be caused by a bacterial infection. Bacteria may enter the breast tissue through cuts or openings in the skin. In women who are breastfeeding, this may occur because of cracked or irritated skin. Cracks in the skin are often caused when your baby does not latch on properly to the breast. °SIGNS AND SYMPTOMS °· Swelling, redness, tenderness, and pain in an area of the breast. °· Swelling of the glands under the arm on the same side. °· Fever may or may not accompany mastitis. °If an infection is allowed to progress, a collection of pus (abscess) may develop. °DIAGNOSIS  °Your health care provider can usually diagnose mastitis based on your symptoms and a physical exam. Tests may be done to help confirm the diagnosis. These may include: °· Removal of pus from the breast by applying pressure to the area. This pus can be examined in the lab to determine which bacteria are present. If an abscess has developed, the fluid in the abscess can be removed with a needle. This can also be used to confirm the diagnosis and  determine the bacteria present. In most cases, pus will not be present. °· Blood tests to determine if your body is fighting a bacterial infection. °· Mammogram or ultrasound tests to rule out other problems or diseases. °TREATMENT  °Mastitis that occurs with breastfeeding will sometimes go away on its own. Your health care provider may choose to wait 24 hours after first seeing you to decide whether a prescription medicine is needed. If your symptoms are worse after 24 hours, your health care provider will likely prescribe an antibiotic medicine to treat the mastitis. He or she will determine which bacteria are most likely causing the infection and will then select an appropriate antibiotic medicine. This is sometimes changed based on the results of tests performed to identify the bacteria, or if there is no response to the antibiotic medicine selected. Antibiotic medicines are usually given by mouth. You may also be given medicine for pain. °HOME CARE INSTRUCTIONS °· Only take over-the-counter or prescription medicines for pain, fever, or discomfort as directed by your health care provider. °· If your health care provider prescribed an antibiotic medicine, take the medicine as directed. Make sure you finish it even if you start to feel better. °· Do not wear a tight or underwire bra. Wear a soft, supportive bra. °· Increase your fluid intake, especially if you have a fever. °· Continue to empty the breast. Your health care provider can tell you whether this milk is safe for your infant or needs to be thrown out. You may be told to stop nursing until your health care provider thinks it is safe for your baby. Use a breast pump if you are advised to stop nursing. °· Keep your nipples clean and dry. °· Empty the first breast completely before going to the other breast. If your baby is not emptying your breasts completely for some reason, use a breast pump to empty your breasts. °· If you go back to work, pump your  breasts while at work to stay in time with your nursing schedule. °· Avoid allowing your breasts to become overly filled with milk (engorged). °SEEK MEDICAL CARE IF: °· You have pus-like discharge from the breast. °· Your symptoms do not improve with the treatment prescribed by your health care provider within 2 days. °SEEK IMMEDIATE MEDICAL CARE IF: °· Your pain and swelling   are getting worse. °· You have pain that is not controlled with medicine. °· You have a red line extending from the breast toward your armpit. °· You have a fever or persistent symptoms for more than 2-3 days. °· You have a fever and your symptoms suddenly get worse. °MAKE SURE YOU:  °· Understand these instructions. °· Will watch your condition. °· Will get help right away if you are not doing well or get worse. °  °This information is not intended to replace advice given to you by your health care provider. Make sure you discuss any questions you have with your health care provider. °  °Document Released: 09/27/2004 Document Revised: 06/07/2013 Document Reviewed: 01/06/2013 °[employer]vier Interactive Patient Education ©2016 [employer] °Breast Pumping Tips °If you are breastfeeding, there may be times when you cannot feed your baby directly. Returning to work or going on a trip are common examples. Pumping allows you to store breast milk and feed it to your baby later.  °You may not get much milk when you first start to pump. Your breasts should start to make more after a few days. If you pump at the times you usually feed your baby, you may be able to keep making enough milk to feed your baby without also using formula. The more often you pump, the more milk you will produce.  °WHEN SHOULD I PUMP?  °· You can begin to pump soon after delivery. However, some experts recommend waiting about 4 weeks before giving your infant a bottle to make sure breastfeeding is going well.  °· If you plan to return to work, begin pumping a few weeks before. This  will help you develop techniques that work best for you. It also lets you build up a supply of breast milk.   °· When you are with your infant, feed on demand and pump after each feeding.   °· When you are away from your infant for several hours, pump for about 15 minutes every 2-3 hours. Pump both breasts at the same time if you can.   °· If your infant has a formula feeding, make sure to pump around the same time.     °· If you drink any alcohol, wait 2 hours before pumping.   °HOW DO I PREPARE TO PUMP? °Your let-down reflex is the natural reaction to stimulation that makes your breast milk flow. It is easier to stimulate this reflex when you are relaxed. Find relaxation techniques that work for you. If you have difficulty with your let-down reflex, try these methods:  °· Smell one of your infant's blankets or an item of clothing.   °· Look at a picture or video of your infant.   °· Sit in a quiet, private space.   °· Massage the breast you plan to pump.   °· Place soothing warmth on the breast.   °· Play relaxing music.   °WHAT ARE SOME GENERAL BREAST PUMPING TIPS? °· Wash your hands before you pump. You do not need to wash your nipples or breasts. °· There are three ways to pump. °· You can use your hand to massage and compress your breast. °· You can use a handheld manual pump. °· You can use an electric pump.   °· Make sure the suction cup (flange) on the breast pump is the right size. Place the flange directly over the nipple. If it is the wrong size or placed the wrong way, it may be painful and cause nipple damage.   °· If pumping is uncomfortable, apply a small amount of purified or modified lanolin   to your nipple and areola. °· If you are using an electric pump, adjust the speed and suction power to be more comfortable. °· If pumping is painful or if you are not getting very much milk, you may need a different type of pump. A lactation consultant can help you determine what type of pump to use.   °· Keep  a full water bottle near you at all times. Drinking lots of fluid helps you make more milk.  °· You can store your milk to use later. Pumped breast milk can be stored in a sealable, sterile container or plastic bag. Label all stored breast milk with the date you pumped it. °· Milk can stay out at room temperature for up to 8 hours. °· You can store your milk in the refrigerator for up to 8 days. °· You can store your milk in the freezer for 3 months. Thaw frozen milk using warm water. Do not put it in the microwave. °· Do not smoke. Smoking can lower your milk supply and harm your infant. If you need help quitting, ask your health care provider to recommend a program.   °WHEN SHOULD I CALL MY HEALTH CARE PROVIDER OR A LACTATION CONSULTANT? °· You are having trouble pumping. °· You are concerned that you are not making enough milk. °· You have nipple pain, soreness, or redness. °· You want to use birth control. Birth control pills may lower your milk supply. Talk to your health care provider about your options. °  °This information is not intended to replace advice given to you by your health care provider. Make sure you discuss any questions you have with your health care provider. °  °Document Released: 11/20/2009 Document Revised: 06/07/2013 Document Reviewed: 03/25/2013 °[employer]vier Interactive Patient Education ©2016 [employer] °Breastfeeding °Deciding to breastfeed is one of the best choices you can make for you and your baby. A change in hormones during pregnancy causes your breast tissue to grow and increases the number and size of your milk ducts. These hormones also allow proteins, sugars, and fats from your blood supply to make breast milk in your milk-producing glands. Hormones prevent breast milk from being released before your baby is born as well as prompt milk flow after birth. Once breastfeeding has begun, thoughts of your baby, as well as his or her sucking or crying, can stimulate the release of  milk from your milk-producing glands.  °BENEFITS OF BREASTFEEDING °For Your Baby °· Your first milk (colostrum) helps your baby's digestive system function better. °· There are antibodies in your milk that help your baby fight off infections. °· Your baby has a lower incidence of asthma, allergies, and sudden infant death syndrome. °· The nutrients in breast milk are better for your baby than infant formulas and are designed uniquely for your baby's needs. °· Breast milk improves your baby's brain development. °· Your baby is less likely to develop other conditions, such as childhood obesity, asthma, or type 2 diabetes mellitus. °For You °· Breastfeeding helps to create a very special bond between you and your baby. °· Breastfeeding is convenient. Breast milk is always available at the correct temperature and costs nothing. °· Breastfeeding helps to burn calories and helps you lose the weight gained during pregnancy. °· Breastfeeding makes your uterus contract to its prepregnancy size faster and slows bleeding (lochia) after you give birth.   °· Breastfeeding helps to lower your risk of developing type 2 diabetes mellitus, osteoporosis, and breast or ovarian cancer later in life. °  SIGNS THAT YOUR BABY IS HUNGRY °Early Signs of Hunger °· Increased alertness or activity. °· Stretching. °· Movement of the head from side to side. °· Movement of the head and opening of the mouth when the corner of the mouth or cheek is stroked (rooting). °· Increased sucking sounds, smacking lips, cooing, sighing, or squeaking. °· Hand-to-mouth movements. °· Increased sucking of fingers or hands. °Late Signs of Hunger °· Fussing. °· Intermittent crying. °Extreme Signs of Hunger °Signs of extreme hunger will require calming and consoling before your baby will be able to breastfeed successfully. Do not wait for the following signs of extreme hunger to occur before you initiate breastfeeding: °· Restlessness. °· A loud, strong  cry. °· Screaming. °BREASTFEEDING BASICS °Breastfeeding Initiation °· Find a comfortable place to sit or lie down, with your neck and back well supported. °· Place a pillow or rolled up blanket under your baby to bring him or her to the level of your breast (if you are seated). Nursing pillows are specially designed to help support your arms and your baby while you breastfeed. °· Make sure that your baby's abdomen is facing your abdomen. °· Gently massage your breast. With your fingertips, massage from your chest wall toward your nipple in a circular motion. This encourages milk flow. You may need to continue this action during the feeding if your milk flows slowly. °· Support your breast with 4 fingers underneath and your thumb above your nipple. Make sure your fingers are well away from your nipple and your baby's mouth. °· Stroke your baby's lips gently with your finger or nipple. °· When your baby's mouth is open wide enough, quickly bring your baby to your breast, placing your entire nipple and as much of the colored area around your nipple (areola) as possible into your baby's mouth. °· More areola should be visible above your baby's upper lip than below the lower lip. °· Your baby's tongue should be between his or her lower gum and your breast. °· Ensure that your baby's mouth is correctly positioned around your nipple (latched). Your baby's lips should create a seal on your breast and be turned out (everted). °· It is common for your baby to suck about 2-3 minutes in order to start the flow of breast milk. °Latching °Teaching your baby how to latch on to your breast properly is very important. An improper latch can cause nipple pain and decreased milk supply for you and poor weight gain in your baby. Also, if your baby is not latched onto your nipple properly, he or she may swallow some air during feeding. This can make your baby fussy. Burping your baby when you switch breasts during the feeding can help to  get rid of the air. However, teaching your baby to latch on properly is still the best way to prevent fussiness from swallowing air while breastfeeding. °Signs that your baby has successfully latched on to your nipple: °· Silent tugging or silent sucking, without causing you pain. °· Swallowing heard between every 3-4 sucks. °· Muscle movement above and in front of his or her ears while sucking. °Signs that your baby has not successfully latched on to nipple: °· Sucking sounds or smacking sounds from your baby while breastfeeding. °· Nipple pain. °If you think your baby has not latched on correctly, slip your finger into the corner of your baby's mouth to break the suction and place it between your baby's gums. Attempt breastfeeding initiation again. °Signs of Successful Breastfeeding °  Signs from your baby: °· A gradual decrease in the number of sucks or complete cessation of sucking. °· Falling asleep. °· Relaxation of his or her body. °· Retention of a small amount of milk in his or her mouth. °· Letting go of your breast by himself or herself. °Signs from you: °· Breasts that have increased in firmness, weight, and size 1-3 hours after feeding. °· Breasts that are softer immediately after breastfeeding. °· Increased milk volume, as well as a change in milk consistency and color by the fifth day of breastfeeding. °· Nipples that are not sore, cracked, or bleeding. °Signs That Your Baby is Getting Enough Milk °· Wetting at least 3 diapers in a 24-hour period. The urine should be clear and pale yellow by age 5 days. °· At least 3 stools in a 24-hour period by age 5 days. The stool should be soft and yellow. °· At least 3 stools in a 24-hour period by age 7 days. The stool should be seedy and yellow. °· No loss of weight greater than 10% of birth weight during the first 3 days of age. °· Average weight gain of 4-7 ounces (113-198 g) per week after age 4 days. °· Consistent daily weight gain by age 5 days, without  weight loss after the age of 2 weeks. °After a feeding, your baby may spit up a small amount. This is common. °BREASTFEEDING FREQUENCY AND DURATION °Frequent feeding will help you make more milk and can prevent sore nipples and breast engorgement. Breastfeed when you feel the need to reduce the fullness of your breasts or when your baby shows signs of hunger. This is called "breastfeeding on demand." Avoid introducing a pacifier to your baby while you are working to establish breastfeeding (the first 4-6 weeks after your baby is born). After this time you may choose to use a pacifier. Research has shown that pacifier use during the first year of a baby's life decreases the risk of sudden infant death syndrome (SIDS). °Allow your baby to feed on each breast as long as he or she wants. Breastfeed until your baby is finished feeding. When your baby unlatches or falls asleep while feeding from the first breast, offer the second breast. Because newborns are often sleepy in the first few weeks of life, you may need to awaken your baby to get him or her to feed. °Breastfeeding times will vary from baby to baby. However, the following rules can serve as a guide to help you ensure that your baby is properly fed: °· Newborns (babies 4 weeks of age or younger) may breastfeed every 1-3 hours. °· Newborns should not go longer than 3 hours during the day or 5 hours during the night without breastfeeding. °· You should breastfeed your baby a minimum of 8 times in a 24-hour period until you begin to introduce solid foods to your baby at around 6 months of age. °BREAST MILK PUMPING °Pumping and storing breast milk allows you to ensure that your baby is exclusively fed your breast milk, even at times when you are unable to breastfeed. This is especially important if you are going back to work while you are still breastfeeding or when you are not able to be present during feedings. Your lactation consultant can give you guidelines on  how long it is safe to store breast milk. °A breast pump is a machine that allows you to pump milk from your breast into a sterile bottle. The pumped breast milk can   then be stored in a refrigerator or freezer. Some breast pumps are operated by hand, while others use electricity. Ask your lactation consultant which type will work best for you. Breast pumps can be purchased, but some hospitals and breastfeeding support groups lease breast pumps on a monthly basis. A lactation consultant can teach you how to hand express breast milk, if you prefer not to use a pump. °CARING FOR YOUR BREASTS WHILE YOU BREASTFEED °Nipples can become dry, cracked, and sore while breastfeeding. The following recommendations can help keep your breasts moisturized and healthy: °· Avoid using soap on your nipples. °· Wear a supportive bra. Although not required, special nursing bras and tank tops are designed to allow access to your breasts for breastfeeding without taking off your entire bra or top. Avoid wearing underwire-style bras or extremely tight bras. °· Air dry your nipples for 3-4 minutes after each feeding. °· Use only cotton bra pads to absorb leaked breast milk. Leaking of breast milk between feedings is normal. °· Use lanolin on your nipples after breastfeeding. Lanolin helps to maintain your skin's normal moisture barrier. If you use pure lanolin, you do not need to wash it off before feeding your baby again. Pure lanolin is not toxic to your baby. You may also hand express a few drops of breast milk and gently massage that milk into your nipples and allow the milk to air dry. °In the first few weeks after giving birth, some women experience extremely full breasts (engorgement). Engorgement can make your breasts feel heavy, warm, and tender to the touch. Engorgement peaks within 3-5 days after you give birth. The following recommendations can help ease engorgement: °· Completely empty your breasts while breastfeeding or  pumping. You may want to start by applying warm, moist heat (in the shower or with warm water-soaked hand towels) just before feeding or pumping. This increases circulation and helps the milk flow. If your baby does not completely empty your breasts while breastfeeding, pump any extra milk after he or she is finished. °· Wear a snug bra (nursing or regular) or tank top for 1-2 days to signal your body to slightly decrease milk production. °· Apply ice packs to your breasts, unless this is too uncomfortable for you. °· Make sure that your baby is latched on and positioned properly while breastfeeding. °If engorgement persists after 48 hours of following these recommendations, contact your health care provider or a lactation consultant. °OVERALL HEALTH CARE RECOMMENDATIONS WHILE BREASTFEEDING °· Eat healthy foods. Alternate between meals and snacks, eating 3 of each per day. Because what you eat affects your breast milk, some of the foods may make your baby more irritable than usual. Avoid eating these foods if you are sure that they are negatively affecting your baby. °· Drink milk, fruit juice, and water to satisfy your thirst (about 10 glasses a day). °· Rest often, relax, and continue to take your prenatal vitamins to prevent fatigue, stress, and anemia. °· Continue breast self-awareness checks. °· Avoid chewing and smoking tobacco. Chemicals from cigarettes that pass into breast milk and exposure to secondhand smoke may harm your baby. °· Avoid alcohol and drug use, including marijuana. °Some medicines that may be harmful to your baby can pass through breast milk. It is important to ask your health care provider before taking any medicine, including all over-the-counter and prescription medicine as well as vitamin and herbal supplements. °It is possible to become pregnant while breastfeeding. If birth control is desired, ask your health care   provider about options that will be safe for your baby. °SEEK MEDICAL  CARE IF: °· You feel like you want to stop breastfeeding or have become frustrated with breastfeeding. °· You have painful breasts or nipples. °· Your nipples are cracked or bleeding. °· Your breasts are red, tender, or warm. °· You have a swollen area on either breast. °· You have a fever or chills. °· You have nausea or vomiting. °· You have drainage other than breast milk from your nipples. °· Your breasts do not become full before feedings by the fifth day after you give birth. °· You feel sad and depressed. °· Your baby is too sleepy to eat well. °· Your baby is having trouble sleeping.   °· Your baby is wetting less than 3 diapers in a 24-hour period. °· Your baby has less than 3 stools in a 24-hour period. °· Your baby's skin or the white part of his or her eyes becomes yellow.   °· Your baby is not gaining weight by 5 days of age. °SEEK IMMEDIATE MEDICAL CARE IF: °· Your baby is overly tired (lethargic) and does not want to wake up and feed. °· Your baby develops an unexplained fever. °  °This information is not intended to replace advice given to you by your health care provider. Make sure you discuss any questions you have with your health care provider. °  °Document Released: 06/02/2005 Document Revised: 02/21/2015 Document Reviewed: 11/24/2012 °Elsevier Interactive Patient Education ©2016 [employer] ° °

## 2015-07-07 NOTE — Progress Notes (Signed)
Patient ID: Bonnie Roth, female   DOB: 11/22/1985, 30 y.o.   MRN: [id number] Post Partum Day #2            Information for the patient's newborn:  Bonnie, Roth [[id number]]  female   / circumcision NOT planning Feeding: breast  Subjective: No HA, SOB, CP, F/C, breast symptoms. Pain well-controlled with ibuprofen. Normal vaginal bleeding, no clots.      Objective:  Temp:  [97.7 F (36.5 C)-98.6 F (37 C)] 97.7 F (36.5 C) (01/21 0631) Pulse Rate:  [74-77] 74 (01/21 0631) Resp:  [18] 18 (01/21 0631) BP: (114-132)/(62-67) 114/67 mmHg (01/21 0631) SpO2:  [98 %-99 %] 99 % (01/21 0631)    Recent Labs  07/05/15 2250  WBC 15.9*  HGB 11.0*  HCT 34.1*  PLT 219    Blood type: A/Positive/-- (01/19 0000) Rubella: Immune (09/12 0000)    Physical Exam:  General: alert, cooperative and no distress Uterine Fundus: firm, midline, U-2 Lochia: appropriate Perineum: 1st degree repair healing well, edema none DVT Evaluation: No evidence of DVT seen on physical exam. Negative Homan's sign. No cords or calf tenderness. Trace calf/ankle edema.   Assessment/Plan: PPD # 2 / 30 y.o., [id number] S/P: spontaneous vaginal waterbirth  Principal Problem:    Postpartum care following vaginal delivery (1/19)    normal postpartum exam  Continue current postpartum care  D/C home   LOS: 2 days   Bonnie Roth, M, MSN, CNM 07/07/2015, 8:07 AM

## 2015-07-07 NOTE — Lactation Note (Signed)
This note was copied from the chart of Bonnie Aloha [place]. Lactation Consultation Note Experienced BF mom. This is her 4th baby. Denies concerns or questions. Discussed engorgement prevent.reviewed out pt. Services. Patient Name: Bonnie Roth [id number] Date: 07/07/2015 Reason for consult: Follow-up assessment   Maternal Data    Feeding Feeding Type: Breast Fed Length of feed: 20 min  LATCH Score/Interventions       Type of Nipple: Everted at rest and after stimulation  Comfort (Breast/Nipple): Soft / non-tender     Hold (Positioning): No assistance needed to correctly position infant at breast.     Lactation Tools Discussed/Used     Consult Status Consult Status: Complete Date: 07/07/15    Charyl Dancer 07/07/2015, 9:58 AM

## 2017-07-08 ENCOUNTER — Inpatient Hospital Stay (HOSPITAL_COMMUNITY): Payer: 59

## 2017-07-08 ENCOUNTER — Other Ambulatory Visit: Payer: Self-pay

## 2017-07-08 ENCOUNTER — Encounter (HOSPITAL_COMMUNITY): Payer: Self-pay

## 2017-07-08 ENCOUNTER — Inpatient Hospital Stay (HOSPITAL_COMMUNITY)
Admission: AD | Admit: 2017-07-08 | Discharge: 2017-07-10 | DRG: 998 | Disposition: A | Discharge status: Home / Self Care | Payer: 59 | Source: Ambulatory Visit | Attending: Family Medicine | Admitting: Family Medicine

## 2017-07-08 DIAGNOSIS — Z8632 Personal history of gestational diabetes: Secondary | ICD-10-CM | POA: Diagnosis present

## 2017-07-08 DIAGNOSIS — Z3A36 36 weeks gestation of pregnancy: Secondary | ICD-10-CM

## 2017-07-08 DIAGNOSIS — R1030 Lower abdominal pain, unspecified: Secondary | ICD-10-CM | POA: Diagnosis not present

## 2017-07-08 DIAGNOSIS — R945 Abnormal results of liver function studies: Secondary | ICD-10-CM

## 2017-07-08 DIAGNOSIS — O288 Other abnormal findings on antenatal screening of mother: Secondary | ICD-10-CM

## 2017-07-08 DIAGNOSIS — D649 Anemia, unspecified: Secondary | ICD-10-CM | POA: Diagnosis present

## 2017-07-08 DIAGNOSIS — O9962 Diseases of the digestive system complicating childbirth: Secondary | ICD-10-CM | POA: Diagnosis not present

## 2017-07-08 DIAGNOSIS — R1013 Epigastric pain: Secondary | ICD-10-CM

## 2017-07-08 DIAGNOSIS — O26613 Liver and biliary tract disorders in pregnancy, third trimester: Secondary | ICD-10-CM

## 2017-07-08 DIAGNOSIS — Z3493 Encounter for supervision of normal pregnancy, unspecified, third trimester: Secondary | ICD-10-CM

## 2017-07-08 DIAGNOSIS — O26899 Other specified pregnancy related conditions, unspecified trimester: Secondary | ICD-10-CM

## 2017-07-08 DIAGNOSIS — K8020 Calculus of gallbladder without cholecystitis without obstruction: Secondary | ICD-10-CM | POA: Diagnosis present

## 2017-07-08 DIAGNOSIS — O99013 Anemia complicating pregnancy, third trimester: Secondary | ICD-10-CM | POA: Diagnosis present

## 2017-07-08 DIAGNOSIS — O368390 Maternal care for abnormalities of the fetal heart rate or rhythm, unspecified trimester, not applicable or unspecified: Secondary | ICD-10-CM

## 2017-07-08 DIAGNOSIS — O0933 Supervision of pregnancy with insufficient antenatal care, third trimester: Secondary | ICD-10-CM | POA: Diagnosis not present

## 2017-07-08 DIAGNOSIS — K851 Biliary acute pancreatitis without necrosis or infection: Secondary | ICD-10-CM | POA: Diagnosis present

## 2017-07-08 DIAGNOSIS — O09299 Supervision of pregnancy with other poor reproductive or obstetric history, unspecified trimester: Secondary | ICD-10-CM

## 2017-07-08 DIAGNOSIS — R7989 Other specified abnormal findings of blood chemistry: Secondary | ICD-10-CM

## 2017-07-08 LAB — CBC WITH DIFFERENTIAL/PLATELET
Basophils Absolute: 0 10*3/uL (ref 0.0–0.1)
Basophils Relative: 0 %
EOS ABS: 0 10*3/uL (ref 0.0–0.7)
Eosinophils Relative: 0 %
HCT: 36.6 % (ref 36.0–46.0)
HEMOGLOBIN: 11.8 g/dL — AB (ref 12.0–15.0)
LYMPHS ABS: 0.7 10*3/uL (ref 0.7–4.0)
Lymphocytes Relative: 6 %
MCH: 26.9 pg (ref 26.0–34.0)
MCHC: 32.2 g/dL (ref 30.0–36.0)
MCV: 83.6 fL (ref 78.0–100.0)
Monocytes Absolute: 0.8 10*3/uL (ref 0.1–1.0)
Monocytes Relative: 7 %
NEUTROS ABS: 10.7 10*3/uL — AB (ref 1.7–7.7)
NEUTROS PCT: 87 %
Platelets: 231 10*3/uL (ref 150–400)
RBC: 4.38 MIL/uL (ref 3.87–5.11)
RDW: 15.1 % (ref 11.5–15.5)
WBC: 12.3 10*3/uL — ABNORMAL HIGH (ref 4.0–10.5)

## 2017-07-08 LAB — URINALYSIS, ROUTINE W REFLEX MICROSCOPIC
BILIRUBIN URINE: NEGATIVE
Glucose, UA: NEGATIVE mg/dL
Hgb urine dipstick: NEGATIVE
Ketones, ur: 20 mg/dL — AB
LEUKOCYTES UA: NEGATIVE
NITRITE: NEGATIVE
Protein, ur: NEGATIVE mg/dL
SPECIFIC GRAVITY, URINE: 1.012 (ref 1.005–1.030)
pH: 6 (ref 5.0–8.0)

## 2017-07-08 LAB — TYPE AND SCREEN
ABO/RH(D): A POS
Antibody Screen: NEGATIVE

## 2017-07-08 LAB — COMPREHENSIVE METABOLIC PANEL
ALBUMIN: 3 g/dL — AB (ref 3.5–5.0)
ALT: 42 U/L (ref 14–54)
AST: 64 U/L — AB (ref 15–41)
Alkaline Phosphatase: 178 U/L — ABNORMAL HIGH (ref 38–126)
Anion gap: 12 (ref 5–15)
BILIRUBIN TOTAL: 2.1 mg/dL — AB (ref 0.3–1.2)
BUN: 8 mg/dL (ref 6–20)
CHLORIDE: 101 mmol/L (ref 101–111)
CO2: 21 mmol/L — ABNORMAL LOW (ref 22–32)
Calcium: 9.6 mg/dL (ref 8.9–10.3)
Creatinine, Ser: 0.51 mg/dL (ref 0.44–1.00)
GFR calc Af Amer: >60 mL/min (ref >60)
GFR calc non Af Amer: >60 mL/min (ref >60)
GLUCOSE: 140 mg/dL — AB (ref 65–99)
POTASSIUM: 3.8 mmol/L (ref 3.5–5.1)
Sodium: 134 mmol/L — ABNORMAL LOW (ref 135–145)
Total Protein: 7.3 g/dL (ref 6.5–8.1)

## 2017-07-08 LAB — PROTEIN / CREATININE RATIO, URINE
Creatinine, Urine: 92 mg/dL
Protein Creatinine Ratio: 0.13 mg/mg{Cre} (ref 0.00–0.15)
Total Protein, Urine: 12 mg/dL

## 2017-07-08 LAB — HEMOGLOBIN A1C
HEMOGLOBIN A1C: 5.4 % (ref 4.8–5.6)
MEAN PLASMA GLUCOSE: 108.28 mg/dL

## 2017-07-08 LAB — HEPATITIS B SURFACE ANTIGEN: HEP B S AG: NEGATIVE

## 2017-07-08 LAB — RPR: RPR: NONREACTIVE

## 2017-07-08 LAB — LIPASE, BLOOD: LIPASE: 1001 U/L — AB (ref 11–51)

## 2017-07-08 LAB — AMYLASE: AMYLASE: 1215 U/L — AB (ref 28–100)

## 2017-07-08 MED ORDER — CALCIUM CARBONATE ANTACID 500 MG PO CHEW: 2.0000 | CHEWABLE_TABLET | ORAL | Status: DC | PRN

## 2017-07-08 MED ORDER — DOCUSATE SODIUM 100 MG PO CAPS: 100.0000 mg | ORAL_CAPSULE | Freq: Every day | ORAL | Status: DC

## 2017-07-08 MED ORDER — ACETAMINOPHEN 325 MG PO TABS
650.0000 mg | ORAL_TABLET | ORAL | Status: DC | PRN
  Administered 2017-07-09: 650 mg via ORAL
  Filled 2017-07-08: qty 2

## 2017-07-08 MED ORDER — LACTATED RINGERS IV SOLN
INTRAVENOUS | Status: DC
  Administered 2017-07-08 – 2017-07-09 (×7): via INTRAVENOUS

## 2017-07-08 MED ORDER — PROMETHAZINE HCL 25 MG/ML IJ SOLN
12.5000 mg | Freq: Once | INTRAMUSCULAR | Status: AC
  Administered 2017-07-08: 12.5 mg via INTRAVENOUS
  Filled 2017-07-08: qty 1

## 2017-07-08 MED ORDER — DOCUSATE SODIUM 100 MG PO CAPS: 100.0000 mg | ORAL_CAPSULE | Freq: Every day | ORAL | Status: DC | PRN

## 2017-07-08 MED ORDER — PROMETHAZINE HCL 25 MG/ML IJ SOLN: 25.0000 mg | Freq: Once | INTRAMUSCULAR | Status: DC

## 2017-07-08 MED ORDER — PRENATAL MULTIVITAMIN CH
1.0000 | ORAL_TABLET | Freq: Every day | ORAL | Status: DC
  Administered 2017-07-08 – 2017-07-09 (×2): 1 via ORAL
  Filled 2017-07-08 (×2): qty 1

## 2017-07-08 MED ORDER — ZOLPIDEM TARTRATE 5 MG PO TABS: 5.0000 mg | ORAL_TABLET | Freq: Every evening | ORAL | Status: DC | PRN

## 2017-07-08 MED ORDER — HYDROMORPHONE HCL 1 MG/ML IJ SOLN
1.0000 mg | INTRAMUSCULAR | Status: DC | PRN
  Administered 2017-07-08 – 2017-07-09 (×3): 1 mg via INTRAVENOUS
  Filled 2017-07-08 (×3): qty 1

## 2017-07-08 MED ORDER — HYDROMORPHONE HCL 1 MG/ML IJ SOLN
1.0000 mg | Freq: Once | INTRAMUSCULAR | Status: AC
  Administered 2017-07-08: 1 mg via INTRAVENOUS
  Filled 2017-07-08: qty 1

## 2017-07-08 MED ORDER — LACTATED RINGERS IV BOLUS (SEPSIS)
2000.0000 mL | Freq: Once | INTRAVENOUS | Status: AC
  Administered 2017-07-08: 2000 mL via INTRAVENOUS

## 2017-07-08 NOTE — H&P (Signed)
History     CSN: [id number]  Arrival date and time: 07/08/17 0137   First Provider Initiated Contact with Patient 07/08/17 0221      Chief Complaint  Patient presents with  . Abdominal Pain   HPI  Ms.  Bonnie Roth is a 32 y.o. year old [id number] female at [redacted]w[redacted]d weeks gestation who presents to [facility] reporting a sudden onset @ 2030 of lower sternal pain that radiates straight through to her back. She states this pain is consistent with the pain she has had in the past when she had a gallbladder attack. She vomited "a lot and really bad" one time. She last ate pizza at 1930 and drank some milk at 2230. She denies LOF or VB.  She reports good (+) FM. She receives no PNC and is planning an unassisted home birth.  Past Medical History:  Diagnosis Date  . Gestational diabetes   . Hx of varicella   . Normal labor 04/24/2013  . Postpartum care following vaginal delivery (11/9) 04/24/2013    Past Surgical History:  Procedure Laterality Date  . arthroscopic knee surgery  2001   Left  . WISDOM TOOTH EXTRACTION      Family History  Problem Relation Age of Onset  . Hypertension Mother   . Depression Mother   . Anemia Mother   . Bipolar disorder Brother   . Depression Brother   . Depression Maternal Grandmother   . Thyroid disease Paternal Grandmother     Social History   Tobacco Use  . Smoking status: Never Smoker  Substance Use Topics  . Alcohol use: Not on file    Comment: none during pregnancy  . Drug use: No    Allergies: No Known Allergies  Medications Prior to Admission  Medication Sig Dispense Refill Last Dose  . ibuprofen (ADVIL,MOTRIN) 600 MG tablet Take 1 tablet (600 mg total) by mouth every 6 (six) hours. 30 tablet 0   . Multiple Vitamin (MULTIVITAMIN WITH MINERALS) TABS tablet Take 1 tablet by mouth daily.   07/04/2015 at Unknown time  . oxyCODONE (OXY IR/ROXICODONE) 5 MG immediate release tablet Take 1 tablet (5 mg total) by mouth every 4 (four) hours as needed  (pain scale 4-7). 20 tablet 0     Review of Systems  Constitutional: Negative.   HENT: Negative.   Eyes: Negative.   Respiratory: Negative.   Cardiovascular: Negative.   Gastrointestinal: Positive for abdominal pain, nausea and vomiting.  Endocrine: Negative.   Genitourinary: Negative.   Musculoskeletal: Negative.   Skin: Negative.   Allergic/Immunologic: Negative.   Neurological: Negative.   Hematological: Negative.   Psychiatric/Behavioral: Negative.    Physical Exam   Blood pressure 112/67, temperature (!) 97.5 F (36.4 C), temperature source Oral, resp. rate 18.  Physical Exam  Nursing note and vitals reviewed. Constitutional: She is oriented to person, place, and time. She appears well-developed and well-nourished.  HENT:  Head: Normocephalic.  Eyes: Pupils are equal, round, and reactive to light.  Neck: Normal range of motion.  Cardiovascular: Normal rate, regular rhythm, normal heart sounds and intact distal pulses.  Respiratory: Effort normal and breath sounds normal.  GI: Soft. Bowel sounds are decreased. There is tenderness (in epigastric region).  Genitourinary:  Genitourinary Comments: Pelvic deferred  Musculoskeletal: Normal range of motion.  Neurological: She is alert and oriented to person, place, and time.  Skin: Skin is warm and dry.  Psychiatric: She has a normal mood and affect. Her behavior is normal. Judgment and thought   content normal.    [facility] Course  Procedures  MDM CCUA CBC with Diff CMP Amylase  Lipase RUQ Abdominal U/S  BPP NST - FHR: 135 bpm / moderate variability / 10 x 10 accels present / decels absent / TOCO: regular every 5 mins  *Consult with Dr. Stinson @ 0740 - notified of patient's complaints, assessments, lab & U/S results - will come see patient to discuss POC  Results for orders placed or performed during the hospital encounter of 07/08/17 (from the past 24 hour(s))  Urinalysis, Routine w reflex microscopic     Status:  Abnormal   Collection Time: 07/08/17  2:10 AM  Result Value Ref Range   Color, Urine YELLOW YELLOW   APPearance CLOUDY (A) CLEAR   Specific Gravity, Urine 1.012 1.005 - 1.030   pH 6.0 5.0 - 8.0   Glucose, UA NEGATIVE NEGATIVE mg/dL   Hgb urine dipstick NEGATIVE NEGATIVE   Bilirubin Urine NEGATIVE NEGATIVE   Ketones, ur 20 (A) NEGATIVE mg/dL   Protein, ur NEGATIVE NEGATIVE mg/dL   Nitrite NEGATIVE NEGATIVE   Leukocytes, UA NEGATIVE NEGATIVE  Protein / creatinine ratio, urine     Status: None   Collection Time: 07/08/17  2:10 AM  Result Value Ref Range   Creatinine, Urine 92.00 mg/dL   Total Protein, Urine 12 mg/dL   Protein Creatinine Ratio 0.13 0.00 - 0.15 mg/mg[Cre]  CBC with Differential/Platelet     Status: Abnormal   Collection Time: 07/08/17  2:20 AM  Result Value Ref Range   WBC 12.3 (H) 4.0 - 10.5 K/uL   RBC 4.38 3.87 - 5.11 MIL/uL   Hemoglobin 11.8 (L) 12.0 - 15.0 g/dL   HCT 36.6 36.0 - 46.0 %   MCV 83.6 78.0 - 100.0 fL   MCH 26.9 26.0 - 34.0 pg   MCHC 32.2 30.0 - 36.0 g/dL   RDW 15.1 11.5 - 15.5 %   Platelets 231 150 - 400 K/uL   Neutrophils Relative % 87 %   Neutro Abs 10.7 (H) 1.7 - 7.7 K/uL   Lymphocytes Relative 6 %   Lymphs Abs 0.7 0.7 - 4.0 K/uL   Monocytes Relative 7 %   Monocytes Absolute 0.8 0.1 - 1.0 K/uL   Eosinophils Relative 0 %   Eosinophils Absolute 0.0 0.0 - 0.7 K/uL   Basophils Relative 0 %   Basophils Absolute 0.0 0.0 - 0.1 K/uL   OB BPP:  2/8 (for AFI) -- cephalic presentation  Us Abdomen Limited Ruq  Result Date: 07/08/2017 CLINICAL DATA:  Right upper quadrant and epigastric pain. Thirty-six weeks pregnant. EXAM: ULTRASOUND ABDOMEN LIMITED RIGHT UPPER QUADRANT COMPARISON:  None. FINDINGS: Gallbladder: Cholelithiasis with small stones demonstrated in the gallbladder. Largest measures 8 mm diameter. Mild gallbladder wall thickening at 4.5 mm. No edema. Murphy's sign is not indicated. Common bile duct: Diameter: 2.7 mm, normal Liver: No focal  lesion identified. Within normal limits in parenchymal echogenicity. Portal vein is patent on color Doppler imaging with normal direction of blood flow towards the liver. IMPRESSION: Cholelithiasis with mild gallbladder wall thickening. No definite evidence of cholecystitis. Electronically Signed   By: William  Stevens M.D.   On: 07/08/2017 06:28    Assessment and Plan  Cholelithiasis affecting pregnancy in third trimester, antepartum - Admit to [facility] - Continue LR bolus x 2 liters - Continue IV pain meds  - GI consult later today - Diet: NPO - Activity Ad Lib    Katerine Morua, MSN, CNM 07/08/2017, 2:29 AM  

## 2017-07-08 NOTE — Progress Notes (Signed)
Referring Provider: Loma Boston, MD Primary Care Physician:  Jefm Petty, MD Primary Gastroenterologist:   None. Unassigned  Reason for Consultation: pancreatitis, cholelithiasis    Attending physician's note   I have taken a history, examined the patient and reviewed the chart. I agree with the Advanced Practitioner's note, impression and recommendations. * Acute mild pancreatitis, very likely biliary. She likely passed a CBD stone or a CBD stone is no longer obstructing the CBD/PD.   * Cholelithiasis with mild GB wall thickening and a normal CBD at 2.7 mm.  Aggressive IV fluid resusitation to counteract 3rd spacing from pancreatitis and to rapidly optimize intravascular volume  Pain control as appropriate NPO today Trend BMET, LFTs, CBC, lipase Cholecystectomy with IOC as soon as feasible post partum   Lucio Edward, MD Marval Regal [phone] Mon-Fri 8a-5p [phone] after 5p, weekends, holidays   ASSESSMENT AND PLAN:   32 yo female, [redacted] week gestation admitted this am with acute pancreatitis, probably biliary with cholelithiasis, elevated mildly elevated Tbili. Alk phos up to 178 but may be related to pregnancy. Mildly elevated AST. No CBD duct dilation on u/s, ? passed a stone.  -She actually looks comfortable now. OB managing pain meds nicely. No N/V at present.  -I spoke with Dr. Nehemiah Settle earlier this am. Onset of symptoms less than 24 hours ago so fluid resus is critical at this point . I asked that patient be given 2 liter bolus of IV fluid then continue current rate of 175 ml / hr -am LFTs. If they rise then will consider repeat imaging to further evaluate for choledocholithisis.  -If continues to do well then may be able to try clears tomorrow, otherwise may need to look at enteral feeding (hopefuly not) -She is going to need a cholecystectomy following delivery.    HPI: Bonnie Roth is a 32 y.o. female G31P4 female at 36w gestation admitted this am with pancreatitis. She has  known cholelithiasis with previous episodes of biliary colic type pain with previous pregnancy. No abdominal pain since then so didn't proceed with further evaluation and treatment of cholelithiasis.  Last night patient had sudden onset of lower sternal pain radiating through to her pain and associated with nausea / vomiting. Pain felt like previous episodes of "gallbladder attacks". She has had milder degree of similar pain throughout this pregnancy but last night it was severe and unrelenting.  In ED:  VSS. Afebrile. WBC 12.3.  Lipase 1215, alk phos 178, Tbili 2.1. U/S reveals cholelithiasis with mild gb wall thickening. No definite evidence of cholecystitis.   HCT last night was 34, it is 36.6 today. Renal function normal. She has been getting LR at 175 ml / hr. She actually feels comfortable at present. Pain is only 2/10. No further N/V.  Bonnie Roth has no chronic GI problems. Mother had gb problems. No [place] of pancreatitis disease. Patient hasn't consumed ETOH during pregnancy. No hx of heavy ETOH use ever. She takes no medications at home despite what is listed on home med list.    Past Medical History:  Diagnosis Date  . Gestational diabetes   . Hx of varicella     Past Surgical History:  Procedure Laterality Date  . arthroscopic knee surgery  2001   Left  . WISDOM TOOTH EXTRACTION      Prior to Admission medications   Medication Sig Start Date End Date Taking? Authorizing Provider  ibuprofen (ADVIL,MOTRIN) 600 MG tablet Take 1 tablet (600 mg total) by mouth every 6 (six) hours.  07/06/15   Julianne Handler, CNM  Multiple Vitamin (MULTIVITAMIN WITH MINERALS) TABS tablet Take 1 tablet by mouth daily.    [provider]  oxyCODONE (OXY IR/ROXICODONE) 5 MG immediate release tablet Take 1 tablet (5 mg total) by mouth every 4 (four) hours as needed (pain scale 4-7). 07/06/15   Julianne Handler, CNM    Current Facility-Administered Medications  Medication Dose Route Frequency Provider  Last Rate Last Dose  . acetaminophen (TYLENOL) tablet 650 mg  650 mg Oral Q4H PRN Truett Mainland, DO      . calcium carbonate (TUMS - dosed in mg elemental calcium) chewable tablet 400 mg of elemental calcium  2 tablet Oral Q4H PRN Truett Mainland, DO      . docusate sodium (COLACE) capsule 100 mg  100 mg Oral Daily Truett Mainland, DO      . HYDROmorphone (DILAUDID) injection 1 mg  1 mg Intravenous Q2H PRN Laury Deep, CNM   1 mg at 07/08/17 9357  . lactated ringers infusion   Intravenous Continuous Truett Mainland, DO 125 mL/hr at 07/08/17 0351    . prenatal multivitamin tablet 1 tablet  1 tablet Oral Q1200 Truett Mainland, DO      . zolpidem (AMBIEN) tablet 5 mg  5 mg Oral QHS PRN Truett Mainland, DO        Allergies as of 07/08/2017  . (No Known Allergies)    Family History  Problem Relation Age of Onset  . Hypertension Mother   . Depression Mother   . Anemia Mother   . Bipolar disorder Brother   . Depression Brother   . Depression Maternal Grandmother   . Thyroid disease Paternal Grandmother     Social History   Socioeconomic History  . Marital status: Married    Spouse name: Not on file  . Number of children: Not on file  . Years of education: Not on file  . Highest education level: Not on file  Social Needs  . Financial resource strain: Not on file  . Food insecurity - worry: Not on file  . Food insecurity - inability: Not on file  . Transportation needs - medical: Not on file  . Transportation needs - non-medical: Not on file  Occupational History  . Not on file  Tobacco Use  . Smoking status: Never Smoker  . Smokeless tobacco: Never Used  Substance and Sexual Activity  . Alcohol use: No    Frequency: Never    Comment: none during pregnancy  . Drug use: No  . Sexual activity: Yes    Birth control/protection: None  Other Topics Concern  . Not on file  Social History Narrative  . Not on file    Review of Systems: All systems reviewed and  negative except where noted in HPI.  Physical Exam: Vital signs in last 24 hours: Temp:  [97.5 F (36.4 C)] 97.5 F (36.4 C) (01/23 0153) Resp:  [18] 18 (01/23 0153) BP: (112)/(67) 112/67 (01/23 0153) SpO2:  [95 %-97 %] 95 % (01/23 0700)   General:   Alert, well-developed,  White female in NAD. Father in room Psych:  Pleasant, cooperative. Normal mood and affect. Eyes:  Pupils equal, sclera clear, no icterus.   Conjunctiva pink. Ears:  Normal auditory acuity. Nose:  No deformity, discharge,  or lesions. Neck:  Supple; no masses Lungs:  Clear throughout to auscultation.   No wheezes, crackles, or rhonchi.  Heart:  Regular rate and rhythm; no murmurs,  no edema Abdomen:  Soft, distended c/w pregnancy. nontender, BS active    Rectal:  Deferred  Msk:  Symmetrical without gross deformities. Marland Kitchen .Neurologic:  Alert and  oriented x4;  grossly normal neurologically. Skin:  Intact without significant lesions or rashes..   Intake/Output from previous day: No intake/output data recorded. Intake/Output this shift: No intake/output data recorded.  Lab Results: Recent Labs    07/08/17 0220  WBC 12.3*  HGB 11.8*  HCT 36.6  PLT 231   BMET Recent Labs    07/08/17 0220  NA 134*  K 3.8  CL 101  CO2 21*  GLUCOSE 140*  BUN 8  CREATININE 0.51  CALCIUM 9.6   LFT Recent Labs    07/08/17 0220  PROT 7.3  ALBUMIN 3.0*  AST 64*  ALT 42  ALKPHOS 178*  BILITOT 2.1*     Studies/Results: [place] Mfm Fetal Bpp Wo Non Stress  Result Date: 07/08/2017 ----------------------------------------------------------------------  OBSTETRICS REPORT                      (Signed Final 07/08/2017 08:52 am) ---------------------------------------------------------------------- Patient Info  ID #:       [id number]                          D.O.B.:  Jan 18, 1986 (31 yrs)  Name:       [facility]                   Visit Date: 07/08/2017 05:35 am  ---------------------------------------------------------------------- Performed By  Performed By:     Berlinda Last          Ref. Address:     [address]                                                             [place], [employer] [place] [employer]  Attending:        Oralia Rud       Secondary Phy.:   MAU Nursing-                    MD  MAU/Triage  Referred By:      Darrold Span                Location:         [facility]                    DAWSON CNM ---------------------------------------------------------------------- Orders   #  Description                                 Code   1  [place] MFM FETAL BPP WO NON STRESS              76819.01  ----------------------------------------------------------------------   #  Ordered By               Order #        Accession #    Episode #   1  Laury Deep           [id number]      [id number]     [id number]  ---------------------------------------------------------------------- Indications   [redacted] weeks gestation of pregnancy                Z3A.36   Insufficient Prenatal Care (no prenatal care,  O09.30   pt planning a home delivery)   Poor obstetric history: Previous gestational   O09.299   diabetes   Poor obstetric history: Previous               O09.299   preeclampsia / eclampsia/gestational HTN   Non-reactive NST                               O28.9  ---------------------------------------------------------------------- OB History  Gravidity:    6         Term:   4        Prem:   0        SAB:   1  TOP:          0       Ectopic:  0        Living: 4 ---------------------------------------------------------------------- Fetal Evaluation  Num Of Fetuses:     1  Fetal Heart         136  Rate(bpm):  Cardiac Activity:   Observed  Presentation:       Cephalic  Amniotic Fluid  AFI  FV:      Subjectively within normal limits  AFI Sum(cm)     %Tile       Largest Pocket(cm)  15.2            56          5.6  RUQ(cm)       RLQ(cm)       LUQ(cm)        LLQ(cm)  4.3           2.5           5.6            2.8  Comment:    2/8 ---------------------------------------------------------------------- Biophysical Evaluation  Amniotic F.V:   Within normal limits       F. Tone:        Not Observed  F. Movement:    Not Observed               Score:  2/8  F. Breathing:   Not Observed ---------------------------------------------------------------------- Gestational Age  Best:          36w 0d     Det. By:  D.O. Conception          EDD:   08/05/17 ---------------------------------------------------------------------- Impression  SIUP at [redacted]w[redacted]d fetal heart rate on ultrasound is normal but fetus does not  demonstrate movements, tone, or breathing  AFI is normal  BPP 2/8 is nonreassuring ---------------------------------------------------------------------- Recommendations  Remote read of ultrasound images only.  Management as  clinically indicated by on site OB provider, noting that typical  management of BPP 2/8 is to pursue delivery. ----------------------------------------------------------------------               JOralia Rud MD Electronically Signed Final Report   07/08/2017 08:52 am ----------------------------------------------------------------------  [place]dom[place] Limited Ruq  Result Date: 07/08/2017 CLINICAL DATA:  Right upper quadrant and epigastric pain. Thirty-six weeks pregnant. EXAM: ULTRASOUND ABDOMEN LIMITED RIGHT UPPER QUADRANT COMPARISON:  None. FINDINGS: Gallbladder: Cholelithiasis with small stones demonstrated in the gallbladder. Largest measures 8 mm diameter. Mild gallbladder wall thickening at 4.5 mm. No edema. Murphy's sign is not indicated. Common bile duct: Diameter: 2.7 mm, normal Liver: No focal lesion identified. Within normal limits in parenchymal echogenicity. Portal  vein is patent on color Doppler imaging with normal direction of blood flow towards the liver. IMPRESSION: Cholelithiasis with mild gallbladder wall thickening. No definite evidence of cholecystitis. Electronically Signed   By: WLucienne CapersM.D.   On: 07/08/2017 06:28    PTye Savoy NP-C @  07/08/2017, 9:02 AM  Pager number [phone]

## 2017-07-08 NOTE — MAU Note (Signed)
Pt here with c/o gallbladder pain. Denies any bleeding or leaking. Denies any problems with the pregnancy.

## 2017-07-08 NOTE — MAU Note (Addendum)
[id number]- informed CNM about BPP  2/10. US tech will come to bedside to do abdominal US so patient can be on fetal monitor.  Fetal HR monitor disturbed during US. Patient reporting feeling non-painful Braxton-Hicks contractions.  [facility] was going to speak with Dr Adrian BlackwaterStinson.

## 2017-07-08 NOTE — MAU Note (Signed)
Patient began having sharp pain in lower sternal area and back around 2030 last night. Says it's consistent with pain she had in previous gall bladder attack.  Denies LOF or bleeding. +FM. No PNC per patient plans on home birth.

## 2017-07-08 NOTE — MAU Provider Note (Signed)
History     CSN: [id number]  Arrival date and time: 07/08/17 0137   First Provider Initiated Contact with Patient 07/08/17 0221      Chief Complaint  Patient presents with  . Abdominal Pain   HPI  Ms.  Bonnie Roth is a 32 y.o. year old G58P4014 female at [redacted]w[redacted]d weeks gestation who presents to MAU reporting a sudden onset @ 2030 of lower sternal pain that radiates straight through to her back. She states this pain is consistent with the pain she has had in the past when she had a gallbladder attack. She vomited "a lot and really bad" one time. She last ate pizza at 1930 and drank some milk at 2230. She denies LOF or VB.  She reports good (+) FM. She receives no [facility] and is planning an unassisted home birth.  Past Medical History:  Diagnosis Date  . Gestational diabetes   . Hx of varicella   . Normal labor 04/24/2013  . Postpartum care following vaginal delivery (11/9) 04/24/2013    Past Surgical History:  Procedure Laterality Date  . arthroscopic knee surgery  2001   Left  . WISDOM TOOTH EXTRACTION      Family History  Problem Relation Age of Onset  . Hypertension Mother   . Depression Mother   . Anemia Mother   . Bipolar disorder Brother   . Depression Brother   . Depression Maternal Grandmother   . Thyroid disease Paternal Grandmother     Social History   Tobacco Use  . Smoking status: Never Smoker  Substance Use Topics  . Alcohol use: Not on file    Comment: none during pregnancy  . Drug use: No    Allergies: No Known Allergies  Medications Prior to Admission  Medication Sig Dispense Refill Last Dose  . ibuprofen (ADVIL,MOTRIN) 600 MG tablet Take 1 tablet (600 mg total) by mouth every 6 (six) hours. 30 tablet 0   . Multiple Vitamin (MULTIVITAMIN WITH MINERALS) TABS tablet Take 1 tablet by mouth daily.   07/04/2015 at Unknown time  . oxyCODONE (OXY IR/ROXICODONE) 5 MG immediate release tablet Take 1 tablet (5 mg total) by mouth every 4 (four) hours as needed  (pain scale 4-7). 20 tablet 0     Review of Systems  Constitutional: Negative.   HENT: Negative.   Eyes: Negative.   Respiratory: Negative.   Cardiovascular: Negative.   Gastrointestinal: Positive for abdominal pain, nausea and vomiting.  Endocrine: Negative.   Genitourinary: Negative.   Musculoskeletal: Negative.   Skin: Negative.   Allergic/Immunologic: Negative.   Neurological: Negative.   Hematological: Negative.   Psychiatric/Behavioral: Negative.    Physical Exam   Blood pressure 112/67, temperature (!) 97.5 F (36.4 C), temperature source Oral, resp. rate 18.  Physical Exam  Nursing note and vitals reviewed. Constitutional: She is oriented to person, place, and time. She appears well-developed and well-nourished.  HENT:  Head: Normocephalic.  Eyes: Pupils are equal, round, and reactive to light.  Neck: Normal range of motion.  Cardiovascular: Normal rate, regular rhythm, normal heart sounds and intact distal pulses.  Respiratory: Effort normal and breath sounds normal.  GI: Soft. Bowel sounds are decreased. There is tenderness (in epigastric region).  Genitourinary:  Genitourinary Comments: Pelvic deferred  Musculoskeletal: Normal range of motion.  Neurological: She is alert and oriented to person, place, and time.  Skin: Skin is warm and dry.  Psychiatric: She has a normal mood and affect. Her behavior is normal. Judgment and thought  content normal.    MAU Course  Procedures  MDM CCUA CBC with Diff CMP Amylase  Lipase RUQ Abdominal U/S  BPP NST - FHR: 135 bpm / moderate variability / 10 x 10 accels present / decels absent / TOCO: regular every 5 mins  *Consult with Dr. Adrian Blackwater @ [phone] - notified of patient's complaints, assessments, lab & U/S results - will come see patient to discuss POC  Results for orders placed or performed during the hospital encounter of 07/08/17 (from the past 24 hour(s))  Urinalysis, Routine w reflex microscopic     Status:  Abnormal   Collection Time: 07/08/17  2:10 AM  Result Value Ref Range   Color, Urine YELLOW YELLOW   APPearance CLOUDY (A) CLEAR   Specific Gravity, Urine 1.012 1.005 - 1.030   pH 6.0 5.0 - 8.0   Glucose, UA NEGATIVE NEGATIVE mg/dL   Hgb urine dipstick NEGATIVE NEGATIVE   Bilirubin Urine NEGATIVE NEGATIVE   Ketones, ur 20 (A) NEGATIVE mg/dL   Protein, ur NEGATIVE NEGATIVE mg/dL   Nitrite NEGATIVE NEGATIVE   Leukocytes, UA NEGATIVE NEGATIVE  Protein / creatinine ratio, urine     Status: None   Collection Time: 07/08/17  2:10 AM  Result Value Ref Range   Creatinine, Urine 92.00 mg/dL   Total Protein, Urine 12 mg/dL   Protein Creatinine Ratio 0.13 0.00 - 0.15 mg/mg[Cre]  CBC with Differential/Platelet     Status: Abnormal   Collection Time: 07/08/17  2:20 AM  Result Value Ref Range   WBC 12.3 (H) 4.0 - 10.5 K/uL   RBC 4.38 3.87 - 5.11 MIL/uL   Hemoglobin 11.8 (L) 12.0 - 15.0 g/dL   HCT 96.0 45.4 - 09.8 %   MCV 83.6 78.0 - 100.0 fL   MCH 26.9 26.0 - 34.0 pg   MCHC 32.2 30.0 - 36.0 g/dL   RDW 11.9 14.7 - 82.9 %   Platelets 231 150 - 400 K/uL   Neutrophils Relative % 87 %   Neutro Abs 10.7 (H) 1.7 - 7.7 K/uL   Lymphocytes Relative 6 %   Lymphs Abs 0.7 0.7 - 4.0 K/uL   Monocytes Relative 7 %   Monocytes Absolute 0.8 0.1 - 1.0 K/uL   Eosinophils Relative 0 %   Eosinophils Absolute 0.0 0.0 - 0.7 K/uL   Basophils Relative 0 %   Basophils Absolute 0.0 0.0 - 0.1 K/uL   OB BPP:  2/8 (for AFI) -- cephalic presentation  US Abdomen Limited Ruq  Result Date: 07/08/2017 CLINICAL DATA:  Right upper quadrant and epigastric pain. Thirty-six weeks pregnant. EXAM: ULTRASOUND ABDOMEN LIMITED RIGHT UPPER QUADRANT COMPARISON:  None. FINDINGS: Gallbladder: Cholelithiasis with small stones demonstrated in the gallbladder. Largest measures 8 mm diameter. Mild gallbladder wall thickening at 4.5 mm. No edema. Murphy's sign is not indicated. Common bile duct: Diameter: 2.7 mm, normal Liver: No focal  lesion identified. Within normal limits in parenchymal echogenicity. Portal vein is patent on color Doppler imaging with normal direction of blood flow towards the liver. IMPRESSION: Cholelithiasis with mild gallbladder wall thickening. No definite evidence of cholecystitis. Electronically Signed   By: Burman Nieves M.D.   On: 07/08/2017 06:28    Assessment and Plan  Cholelithiasis affecting pregnancy in third trimester, antepartum - Admit to HROB - Continue LR bolus x 2 liters - Continue IV pain meds  - GI consult later today - Diet: NPO - Activity Ad Lib    [name], MSN, CNM 07/08/2017, 2:29 AM

## 2017-07-08 NOTE — Progress Notes (Signed)
Daily Antepartum Note  Admission Date: 07/08/2017 Current Date: 07/08/2017 12:14 PM  Margalit Danny Lawlessrintz is a 32 y.o. B1Y7829G6P4014 @ 6095w0d by (36wk u/s), HD#1, admitted for RUQ pain, gallstone pancreatitis.  Pregnancy complicated by: Patient Active Problem List   Diagnosis Date Noted  . Cholelithiasis affecting pregnancy in third trimester, antepartum 07/08/2017  . Acute gallstone pancreatitis 07/08/2017  . No prenatal care in current pregnancy in third trimester 07/08/2017    Overnight/24hr events:  See h&p.  Subjective:  Pt feeling much better. No GI s/s currently or decreased fetal movements or preterm labor s/s  Objective:    Current Vital Signs 24h Vital Sign Ranges  T 98.3 F (36.8 C) Temp  Avg: 97.9 F (36.6 C)  Min: 97.5 F (36.4 C)  Max: 98.3 F (36.8 C)  BP 124/74 BP  Min: 112/67  Max: 124/74  HR   No Data Recorded  RR 20 Resp  Avg: 19  Min: 18  Max: 20  SaO2 98 % Not Delivered SpO2  Avg: 96.4 %  Min: 95 %  Max: 98 %       24 Hour I/O Current Shift I/O  Time Ins Outs No intake/output data recorded. No intake/output data recorded.   Physical exam: General: Well nourished, well developed female in no acute distress.  Medications: Current Facility-Administered Medications  Medication Dose Route Frequency Provider Last Rate Last Dose  . acetaminophen (TYLENOL) tablet 650 mg  650 mg Oral Q4H PRN Levie HeritageStinson, Jacob J, DO      . calcium carbonate (TUMS - dosed in mg elemental calcium) chewable tablet 400 mg of elemental calcium  2 tablet Oral Q4H PRN Levie HeritageStinson, Jacob J, DO      . docusate sodium (COLACE) capsule 100 mg  100 mg Oral Daily PRN Monango BingPickens, Atul Delucia, MD      . HYDROmorphone (DILAUDID) injection 1 mg  1 mg Intravenous Q2H PRN Raelyn Moraawson, Rolitta, CNM   1 mg at 07/08/17 [id number]  . lactated ringers bolus 2,000 mL  2,000 mL Intravenous Once Levie HeritageStinson, Jacob J, DO 1,000 mL/hr at 07/08/17 1203 2,000 mL at 07/08/17 1203  . lactated ringers infusion   Intravenous Continuous Levie HeritageStinson, Jacob  J, DO 175 mL/hr at 07/08/17 [id number]    . prenatal multivitamin tablet 1 tablet  1 tablet Oral Q1200 Levie HeritageStinson, Jacob J, DO   1 tablet at 07/08/17 1203  . zolpidem (AMBIEN) tablet 5 mg  5 mg Oral QHS PRN Levie HeritageStinson, Jacob J, DO        Labs:  Recent Labs  Lab 07/08/17 0220  WBC 12.3*  HGB 11.8*  HCT 36.6  PLT 231    Recent Labs  Lab 07/08/17 0220  NA 134*  K 3.8  CL 101  CO2 21*  BUN 8  CREATININE 0.51  CALCIUM 9.6  PROT 7.3  BILITOT 2.1*  ALKPHOS 178*  ALT 42  AST 64*  GLUCOSE 140*   Amylase 1/23: 1215  Lipase 1/23: 1001  Pending: a1c, hiv, rubella screen, hepb surf ag   Radiology:  1/23: repeat BPP 10/10 Cephalic, efw 3278gm, 89%, AC >57%>97% AFI 12.6  Assessment & Plan:  Pt doing well *Pregnancy: fetal status reassuring with 10/10 bpp; likely low bpp due to just got IV narcotics prior to u/s. qday NSTs. Continue prenatal vitamin. Reviewed u/s with patient  -pt planning homebirth and she states she was being seen by a midwife; has never had a formal u/s until today.  *GI: GI following and appreciate recs.  *Preterm: no  current issues *PPx: scds, oob ad lib *FEN/GI: MIVF, NPO until repeat labs in am and seen by GI tomorrow *Dispo: pending continuing improvement in s/s.   Cornelia Copa MD Attending Center for [facility])

## 2017-07-09 LAB — COMPREHENSIVE METABOLIC PANEL
ALBUMIN: 2.3 g/dL — AB (ref 3.5–5.0)
ALT: 31 U/L (ref 14–54)
AST: 27 U/L (ref 15–41)
Alkaline Phosphatase: 119 U/L (ref 38–126)
Anion gap: 9 (ref 5–15)
BUN: 6 mg/dL (ref 6–20)
CHLORIDE: 109 mmol/L (ref 101–111)
CO2: 19 mmol/L — AB (ref 22–32)
CREATININE: 0.49 mg/dL (ref 0.44–1.00)
Calcium: 8.3 mg/dL — ABNORMAL LOW (ref 8.9–10.3)
GFR calc Af Amer: >60 mL/min (ref >60)
GLUCOSE: 92 mg/dL (ref 65–99)
POTASSIUM: 3.6 mmol/L (ref 3.5–5.1)
Sodium: 137 mmol/L (ref 135–145)
Total Bilirubin: 1.6 mg/dL — ABNORMAL HIGH (ref 0.3–1.2)
Total Protein: 5.6 g/dL — ABNORMAL LOW (ref 6.5–8.1)

## 2017-07-09 LAB — HIV ANTIBODY (ROUTINE TESTING W REFLEX): HIV Screen 4th Generation wRfx: NONREACTIVE

## 2017-07-09 LAB — RUBELLA SCREEN: Rubella: <0.9 index — ABNORMAL LOW (ref >0.99)

## 2017-07-09 LAB — LIPASE, BLOOD: Lipase: 908 U/L — ABNORMAL HIGH (ref 11–51)

## 2017-07-09 LAB — CBC
HCT: 27.7 % — ABNORMAL LOW (ref 36.0–46.0)
Hemoglobin: 8.9 g/dL — ABNORMAL LOW (ref 12.0–15.0)
MCH: 27.1 pg (ref 26.0–34.0)
MCHC: 32.1 g/dL (ref 30.0–36.0)
MCV: 84.5 fL (ref 78.0–100.0)
Platelets: 195 10*3/uL (ref 150–400)
RBC: 3.28 MIL/uL — ABNORMAL LOW (ref 3.87–5.11)
RDW: 15.5 % (ref 11.5–15.5)
WBC: 8.4 10*3/uL (ref 4.0–10.5)

## 2017-07-09 LAB — GC/CHLAMYDIA PROBE AMP (~~LOC~~) NOT AT ARMC
Chlamydia: NEGATIVE
Neisseria Gonorrhea: NEGATIVE

## 2017-07-09 MED ORDER — HYDROMORPHONE HCL 1 MG/ML IJ SOLN
0.5000 mg | INTRAMUSCULAR | Status: DC | PRN
  Administered 2017-07-09 – 2017-07-10 (×2): 0.5 mg via INTRAVENOUS
  Filled 2017-07-09 (×2): qty 1

## 2017-07-09 MED ORDER — MORPHINE SULFATE (PF) 4 MG/ML IV SOLN
2.0000 mg | INTRAVENOUS | Status: DC | PRN
Start: 2017-07-09 — End: 2017-07-09
  Filled 2017-07-09: qty 1

## 2017-07-09 NOTE — Progress Notes (Signed)
[facility] Progress Note  Chief Complaint:   Pancreatitis   Subjective: Feels better today, not much abdominal pain. Inquiring about diet  ASSESSMENT / PLAN:   32 yo female, 36w gestation admitted with acute pancreatitis, probably passed gallstone. Cholelithiasis with mild gb wall thickening on U/S. Clinically improving. Not as much pain through the night or this am. She is hungry. Lipase 908. WBC normal. Tbili better from  2.1 >>> 1.6 today.  -trial of clears. If tolerates then advance to fulls for dinner. If tolerates fulls can give low fat diet in am.  -will cut IVF back from 175 ml / hr to 100 ml / hr  Objective:  Vital signs in last 24 hours: Temp:  [98.1 F (36.7 C)-98.6 F (37 C)] 98.1 F (36.7 C) (01/24 0751) Pulse Rate:  [92-99] 92 (01/24 0751) Resp:  [18-20] 18 (01/24 0751) BP: (92-121)/(44-56) 92/45 (01/24 0751) SpO2:  [96 %-99 %] 99 % (01/24 0751) Last BM Date: 07/08/17 General:   Alert, well-developed, white female   in NAD EENT:  Normal hearing, non icteric sclera, conjunctive pink.  Heart:  Regular rate and rhythm; no lower extremity edema Pulm: Normal respiratory effort, lungs CTA bilaterally without wheezes or crackles. Abdomen:  Soft, distended c/w pregnancy, nontender.  Normal bowel sounds, no masses felt. No hepatomegaly.    Neurologic:  Alert and  oriented x4;  grossly normal neurologically. Psych:  Pleasant, cooperative.  Normal mood and affect.   Intake/Output from previous day: 01/23 0701 - 01/24 0700 In: 875 [I.V.:875] Out: 2600 [Urine:2600] Intake/Output this shift: Total I/O In: -  Out: 800 [Urine:800]  Lab Results: Recent Labs    07/08/17 0220 07/09/17 0603  WBC 12.3* 8.4  HGB 11.8* 8.9*  HCT 36.6 27.7*  PLT 231 195   BMET Recent Labs    07/08/17 0220 07/09/17 0603  NA 134* 137  K 3.8 3.6  CL 101 109  CO2 21* 19*  GLUCOSE 140* 92  BUN 8 6  CREATININE 0.51 0.49  CALCIUM 9.6 8.3*   LFT Recent Labs   07/09/17 0603  PROT 5.6*  ALBUMIN 2.3*  AST 27  ALT 31  ALKPHOS 119  BILITOT 1.6*    US Abdomen Limited Ruq  Result Date: 07/08/2017 CLINICAL DATA:  Right upper quadrant and epigastric pain. Thirty-six weeks pregnant. EXAM: ULTRASOUND ABDOMEN LIMITED RIGHT UPPER QUADRANT COMPARISON:  None. FINDINGS: Gallbladder: Cholelithiasis with small stones demonstrated in the gallbladder. Largest measures 8 mm diameter. Mild gallbladder wall thickening at 4.5 mm. No edema. Murphy's sign is not indicated. Common bile duct: Diameter: 2.7 mm, normal Liver: No focal lesion identified. Within normal limits in parenchymal echogenicity. Portal vein is patent on color Doppler imaging with normal direction of blood flow towards the liver. IMPRESSION: Cholelithiasis with mild gallbladder wall thickening. No definite evidence of cholecystitis. Electronically Signed   By: Burman Nieves M.D.   On: 07/08/2017 06:28    Principal Problem:   Cholelithiasis affecting pregnancy in third trimester, antepartum Active Problems:   Acute gallstone pancreatitis   No prenatal care in current pregnancy in third trimester   History of gestational diabetes mellitus (GDM)   History of macrosomia in infant in prior pregnancy, currently pregnant   Elevated LFTs   LOS: 1 day   Willette Cluster ,NP 07/09/2017, 12:24 PM Pager number [phone]     Attending physician's note   I have taken an interval history, reviewed the chart and examined the patient. I agree with the  Advanced Practitioner's note, impression and recommendations.  Acute, mild biliary pancreatitis is resolving rapidly.  LFTs improved c/w a passed CBD stone or non obstructing CBD stone.  She is now well hydrated. Anemia unmasked with hydration.  IVF decreased to 100/hr. She is tolerating full liquids well. If she does well overnight advance to a low fat diet and remain on a low fat diet until cholecystectomy. Consider discharge home tomorrow if she is doing  well. She needs an appt with General Surgery soon as outpatient for cholecystectomy. Mgmt discussed with Dr. Vergie LivingPickens. GI signing off. Please call if needed.   Claudette HeadMalcolm Denijah Karrer, MD Clementeen GrahamFACG [phone] Mon-Fri 8a-5p [phone] after 5p, weekends, holidays

## 2017-07-09 NOTE — Progress Notes (Signed)
Faculty Note  Patient with back pain. She is feeling occasional, rare contraction. Denies leaking or bleeding. Reports active fetal movement. She denies nausea/vomiting and all abdominal pain, states this is not like her pancreatitis pain she was feeling on admission.  She reports back pain that is minimally improved with heating pad, requesting pain medicine. Will give dilaudid at lower dose, she is agreeable to plan.  Baldemar LenisK. Meryl Davis, M.D. Attending Obstetrician & Gynecologist, [facility] for [facility], [facility]

## 2017-07-10 MED ORDER — HYDROMORPHONE HCL 2 MG PO TABS: 2.0000 mg | ORAL_TABLET | Freq: Four times a day (QID) | ORAL | 0 refills | Status: DC | PRN

## 2017-07-10 NOTE — Progress Notes (Signed)
D/c home ambulated out teaching complete

## 2017-07-10 NOTE — Discharge Instructions (Signed)
Cholecystitis  Acute Pancreatitis Acute pancreatitis is a condition in which the pancreas suddenly becomes irritated and swollen (has inflammation). The pancreas is a gland that is located behind the stomach. It produces enzymes that help to digest food. The pancreas also releases the hormones glucagon and insulin, which help to regulate blood sugar. Damage to the pancreas occurs when the digestive enzymes from the pancreas are activated before they are released into the intestine. Most acute attacks last a couple of days and can cause serious problems. Some people become dehydrated and develop low blood pressure. In severe cases, bleeding into the pancreas can lead to shock and can be life-threatening. The lungs, heart, and kidneys may fail. What are the causes? The most common causes of this condition are:  Alcohol abuse.  Gallstones.  Other causes include:  Certain medicines.  Exposure to certain chemicals.  Infection.  Damage caused by an accident (trauma).  Abdominal surgery.  In some cases, the cause may not be known. What are the signs or symptoms? Symptoms of this condition include:  Pain in the upper abdomen that may radiate to the back.  Tenderness and swelling of the abdomen.  Nausea and vomiting.  How is this diagnosed? This condition may be diagnosed based on:  A physical exam.  Blood tests.  Imaging tests, such as X-rays, CT scans, or an ultrasound of the abdomen.  How is this treated? Treatment for this condition usually requires a stay in the hospital. Treatment may include:  Pain medicine.  Fluid replacement through an IV tube.  Placing a tube in the stomach to remove stomach contents and to control vomiting (NG tube, or nasogastric tube).  Not eating for 3-4 days. This gives the pancreas a rest, because enzymes are not being produced that can cause further damage.  Antibiotic medicines, if your condition is caused by an infection.  Surgery on  the pancreas or gallbladder.  Follow these instructions at home: Eating and drinking  Follow instructions from your health care provider about diet. This may involve avoiding alcohol and decreasing the amount of fat in your diet.  Eat smaller, more frequent meals. This reduces the amount of digestive fluids that the pancreas produces.  Drink enough fluid to keep your urine clear or pale yellow.  Do not drink alcohol if it caused your condition. General instructions  Take over-the-counter and prescription medicines only as told by your health care provider.  Do not use any tobacco products, such as cigarettes, chewing tobacco, and e-cigarettes. If you need help quitting, ask your health care provider.  Get plenty of rest.  If directed, check your blood sugar at home as told by your health care provider.  Keep all follow-up visits as told by your health care provider. This is important. Contact a health care provider if:  You do not recover as quickly as expected.  You develop new or worsening symptoms.  You have persistent pain, weakness, or nausea.  You recover and then have another episode of pain.  You have a fever. Get help right away if:  You cannot eat or keep fluids down.  Your pain becomes severe.  Your skin or the white part of your eyes turns yellow (jaundice).  You vomit.  You feel dizzy or you faint.  Your blood sugar is high (over 300 mg/dL). This information is not intended to replace advice given to you by your health care provider. Make sure you discuss any questions you have with your health care provider.  Document Released: 06/02/2005 Document Revised: 10/10/2015 Document Reviewed: 03/06/2015 Elsevier Interactive Patient Education  2018 [employer] Cholecystitis is swelling and irritation (inflammation) of the gallbladder. The gallbladder is an organ that is shaped like a pear. It is under the liver on the right side of the body. This condition is  often caused by gallstones. You doctor may do tests to see how your gallbladder works. These tests may include:  Imaging tests, such as: ? An ultrasound. ? MRI.  Tests that check how your liver works.  This condition needs treatment. Follow these instructions at home: Home care will depend on your treatment. In general:  Take over-the-counter and prescription medicines only as told by your doctor.  If you were prescribed an antibiotic medicine, take it as told by your doctor. Do not stop taking the antibiotic even if you start to feel better.  Follow instructions from your doctor about what to eat or drink. When you are allowed to eat, avoid eating or drinking anything that causes your symptoms to start.  Keep all follow-up visits as told by your doctor. This is important.  Contact a doctor if:  You have pain and your medicine does not help.  You have a fever. Get help right away if:  Your pain moves to: ? Another part of your belly (abdomen). ? Your back.  Your symptoms do not go away.  You have new symptoms. This information is not intended to replace advice given to you by your health care provider. Make sure you discuss any questions you have with your health care provider. Document Released: 05/22/2011 Document Revised: 11/08/2015 Document Reviewed: 09/13/2014 Elsevier Interactive Patient Education  2018 [employer]

## 2017-07-10 NOTE — Discharge Summary (Signed)
Discharge Summary   Admit Date: 07/08/2017 Discharge Date: 07/10/2017 Discharging Service: Antepartum   Primary OBGYN: None. Patient planning homebirth with midwife Admitting Physician: Levie HeritageJacob J Stinson, DO  Discharge Physician: Vergie LivingPickens  Referring Provider: Maternity Admissions Unit  Primary Care Provider: Loyal JacobsonKalish, Michael, MD  Admission Diagnoses: *Pregnancy at 36/0 *RUQ pain *Gallstone Pancreatitis  Discharge Diagnoses: *Pregnancy at 36/2 *Resolved pain  Consult Orders: GI   Surgeries/Procedures Performed: None  History and Physical: Attestation signed by Levie HeritageStinson, Jacob J, DO at 07/09/2017 4:21 PM  Attestation of Attending Supervision of Advanced Practitioner (PA/CNM/NP): Evaluation and management procedures were performed by the Advanced Practitioner under my supervision and collaboration.  I have reviewed the Advanced Practitioner's note and chart, and I agree with the management and plan.  Candelaria CelesteJacob Stinson, DO Attending Physician Faculty Practice, [facility]           [] Hide copied text  [] Hover for details     History     CSN: [id number]  Arrival date and time: 07/08/17 0137   First Provider Initiated Contact with Patient 07/08/17 0221         Chief Complaint  Patient presents with  . Abdominal Pain   HPI  Ms.  Bonnie Roth is a 32 y.o. year old [id number] female at 3510w0d weeks gestation who presents to [facility] reporting a sudden onset @ 2030 of lower sternal pain that radiates straight through to her back. She states this pain is consistent with the pain she has had in the past when she had a gallbladder attack. She vomited "a lot and really bad" one time. She last ate pizza at 1930 and drank some milk at 2230. She denies LOF or VB.  She reports good (+) FM. [facility] and is planning an unassisted home birth.      Past Medical History:  Diagnosis Date  . Gestational diabetes   . Hx of varicella   . Normal  labor 04/24/2013  . Postpartum care following vaginal delivery (11/9) 04/24/2013         Past Surgical History:  Procedure Laterality Date  . arthroscopic knee surgery  2001   Left  . WISDOM TOOTH EXTRACTION           Family History  Problem Relation Age of Onset  . Hypertension Mother   . Depression Mother   . Anemia Mother   . Bipolar disorder Brother   . Depression Brother   . Depression Maternal Grandmother   . Thyroid disease Paternal Grandmother     Social History        Tobacco Use  . Smoking status: Never Smoker  Substance Use Topics  . Alcohol use: Not on file    Comment: none during pregnancy  . Drug use: No    Allergies: No Known Allergies         Medications Prior to Admission  Medication Sig Dispense Refill Last Dose  . ibuprofen (ADVIL,MOTRIN) 600 MG tablet Take 1 tablet (600 mg total) by mouth every 6 (six) hours. 30 tablet 0   . Multiple Vitamin (MULTIVITAMIN WITH MINERALS) TABS tablet Take 1 tablet by mouth daily.   07/04/2015 at Unknown time  . oxyCODONE (OXY IR/ROXICODONE) 5 MG immediate release tablet Take 1 tablet (5 mg total) by mouth every 4 (four) hours as needed (pain scale 4-7). 20 tablet 0     Review of Systems  Constitutional: Negative.   HENT: Negative.   Eyes: Negative.   Respiratory: Negative.  Cardiovascular: Negative.   Gastrointestinal: Positive for abdominal pain, nausea and vomiting.  Endocrine: Negative.   Genitourinary: Negative.   Musculoskeletal: Negative.   Skin: Negative.   Allergic/Immunologic: Negative.   Neurological: Negative.   Hematological: Negative.   Psychiatric/Behavioral: Negative.    Physical Exam   Blood pressure 112/67, temperature (!) 97.5 F (36.4 C), temperature source Oral, resp. rate 18.  Physical Exam  Nursing note and vitals reviewed. Constitutional: She is oriented to person, place, and time. She appears well-developed and well-nourished.  HENT:  Head:  Normocephalic.  Eyes: Pupils are equal, round, and reactive to light.  Neck: Normal range of motion.  Cardiovascular: Normal rate, regular rhythm, normal heart sounds and intact distal pulses.  Respiratory: Effort normal and breath sounds normal.  GI: Soft. Bowel sounds are decreased. There is tenderness (in epigastric region).  Genitourinary:  Genitourinary Comments: Pelvic deferred  Musculoskeletal: Normal range of motion.  Neurological: She is alert and oriented to person, place, and time.  Skin: Skin is warm and dry.  Psychiatric: She has a normal mood and affect. Her behavior is normal. Judgment and thought content normal.    MAU Course  Procedures  MDM CCUA CBC with Diff CMP Amylase  Lipase RUQ Abdominal U/S  BPP NST - FHR: 135 bpm / moderate variability / 10 x 10 accels present / decels absent / TOCO: regular every 5 mins  *Consult with Dr. Adrian Blackwater @ [phone] - notified of patient's complaints, assessments, lab & U/S results - will come see patient to discuss POC       Results for orders placed or performed during the hospital encounter of 07/08/17 (from the past 24 hour(s))  Urinalysis, Routine w reflex microscopic     Status: Abnormal   Collection Time: 07/08/17  2:10 AM  Result Value Ref Range   Color, Urine YELLOW YELLOW   APPearance CLOUDY (A) CLEAR   Specific Gravity, Urine 1.012 1.005 - 1.030   pH 6.0 5.0 - 8.0   Glucose, UA NEGATIVE NEGATIVE mg/dL   Hgb urine dipstick NEGATIVE NEGATIVE   Bilirubin Urine NEGATIVE NEGATIVE   Ketones, ur 20 (A) NEGATIVE mg/dL   Protein, ur NEGATIVE NEGATIVE mg/dL   Nitrite NEGATIVE NEGATIVE   Leukocytes, UA NEGATIVE NEGATIVE  Protein / creatinine ratio, urine     Status: None   Collection Time: 07/08/17  2:10 AM  Result Value Ref Range   Creatinine, Urine 92.00 mg/dL   Total Protein, Urine 12 mg/dL   Protein Creatinine Ratio 0.13 0.00 - 0.15 mg/mg[Cre]  CBC with Differential/Platelet     Status: Abnormal    Collection Time: 07/08/17  2:20 AM  Result Value Ref Range   WBC 12.3 (H) 4.0 - 10.5 K/uL   RBC 4.38 3.87 - 5.11 MIL/uL   Hemoglobin 11.8 (L) 12.0 - 15.0 g/dL   HCT 96.0 45.4 - 09.8 %   MCV 83.6 78.0 - 100.0 fL   MCH 26.9 26.0 - 34.0 pg   MCHC 32.2 30.0 - 36.0 g/dL   RDW 11.9 14.7 - 82.9 %   Platelets 231 150 - 400 K/uL   Neutrophils Relative % 87 %   Neutro Abs 10.7 (H) 1.7 - 7.7 K/uL   Lymphocytes Relative 6 %   Lymphs Abs 0.7 0.7 - 4.0 K/uL   Monocytes Relative 7 %   Monocytes Absolute 0.8 0.1 - 1.0 K/uL   Eosinophils Relative 0 %   Eosinophils Absolute 0.0 0.0 - 0.7 K/uL   Basophils Relative 0 %  Basophils Absolute 0.0 0.0 - 0.1 K/uL   OB BPP:  2/8 (for AFI) -- cephalic presentation  US Abdomen Limited Ruq  Result Date: 07/08/2017 CLINICAL DATA:  Right upper quadrant and epigastric pain. Thirty-six weeks pregnant. EXAM: ULTRASOUND ABDOMEN LIMITED RIGHT UPPER QUADRANT COMPARISON:  None. FINDINGS: Gallbladder: Cholelithiasis with small stones demonstrated in the gallbladder. Largest measures 8 mm diameter. Mild gallbladder wall thickening at 4.5 mm. No edema. Murphy's sign is not indicated. Common bile duct: Diameter: 2.7 mm, normal Liver: No focal lesion identified. Within normal limits in parenchymal echogenicity. Portal vein is patent on color Doppler imaging with normal direction of blood flow towards the liver. IMPRESSION: Cholelithiasis with mild gallbladder wall thickening. No definite evidence of cholecystitis. Electronically Signed   By: Burman Nieves M.D.   On: 07/08/2017 06:28    Assessment and Plan  Cholelithiasis affecting pregnancy in third trimester, antepartum - Admit to HROB - Continue LR bolus x 2 liters - Continue IV pain meds  - GI consult later today - Diet: NPO - Activity Ad Lib    Bonnie Mora, MSN, CNM 07/08/2017, 2:29 AM            Cosigned by: Levie Heritage, DO at 07/09/2017 4:21 PM  Electronically  signed by Bonnie Roth, CNM at 07/08/2017 8:17 AM Electronically signed by Levie Heritage, DO at 07/09/2017 4:21 PM   Hospital Course: *Pregnancy: fetal status reassuring with 10/10 bpp on repeat u/s; likely low bpp on admission due to just got IV narcotics prior to u/s. qday NST was reactive during hospital stay  1/24: afi 12.6, ceph, efw 89%3278g, AC >97%. U/s c/w patient endorsed dating.  -pt planning homebirth and she states she was being seen by a midwife; has never had a formal u/s until admission. Pt declines offer to start prenatal care with [place].  *GI: followed by GI and pt improved with exp management with aggressive hydration and pain control. Was able to tolerate low fat diet on discharge and pt aware to continue this when she gets home. Will set up to see [facility] approx 6wks PP for evaluation for likely removal of GB *Preterm: no issues *PPx: scds, oob ad lib *FEN/GI: see above   Discharge Exam:   Current Vital Signs 24h Vital Sign Ranges  T 98.3 F (36.8 C) Temp  Avg: 98.1 F (36.7 C)  Min: 97.7 F (36.5 C)  Max: 98.6 F (37 C)  BP 114/67 BP  Min: 106/58  Max: 118/57  HR 88 Pulse  Avg: 89.7  Min: 87  Max: 92  RR 16 Resp  Avg: 17.7  Min: 16  Max: 18  SaO2 100 % Not Delivered SpO2  Avg: 98.2 %  Min: 97 %  Max: 100 %       24 Hour I/O Current Shift I/O  Time Ins Outs 01/24 0701 - 01/25 0700 In: 3608.8 [P.O.:240; I.V.:3368.8] Out: 3400 [Urine:3400] 01/25 0701 - 01/25 1900 In: 0  Out: 1500 [Urine:1500]    Patient Vitals for the past 24 hrs:  BP Temp Temp src Pulse Resp SpO2  07/10/17 0817 114/67 98.3 F (36.8 C) Oral 88 16 100 %  07/10/17 0454 (!) 108/51 97.8 F (36.6 C) Oral 87 18 99 %  07/10/17 0051 (!) 118/57 98.2 F (36.8 C) Oral 92 18 98 %  07/09/17 2059 111/62 97.7 F (36.5 C) Oral 88 18 97 %  07/09/17 1552 (!) 110/55 98.6 F (37 C) Oral 92 18 97 %  07/09/17 1159 (!)  106/58 98 F (36.7 C) Oral 91 18 98 %   General appearance: Well nourished, well  developed female in no acute distress.  Neck:  Supple, normal appearance, and no thyromegaly  Cardiovascular: S1, S2 normal, no murmur, rub or gallop, regular rate and rhythm Respiratory:  Clear to auscultation bilateral. Normal respiratory effort Abdomen: gravid, nttp Neuro/Psych:  Normal mood and affect.  Skin:  Warm and dry.   Discharge Disposition:  Home  Patient Instructions:  Standard. Low fat diet   Results Pending at Discharge:  None  Discharge Medications: Allergies as of 07/10/2017   No Known Allergies     Medication List    TAKE these medications   diphenhydrAMINE 25 MG tablet Commonly known as:  BENADRYL Take 25 mg by mouth every 6 (six) hours as needed for sleep.   HYDROmorphone 2 MG tablet Commonly known as:  DILAUDID Take 1 tablet (2 mg total) by mouth every 6 (six) hours as needed for severe pain.       No future appointments.  Cornelia Copa MD Attending Center for [facility])

## 2017-07-10 NOTE — Progress Notes (Signed)
Daily Antepartum Note  Admission Date: 07/08/2017 Current Date: 07/09/2017 1:50 PM  Bonnie Roth is a 32 y.o. [id number] @ 6251w1d by (36wk u/s), HD#2, admitted for RUQ pain, gallstone pancreatitis.  Pregnancy complicated by: Patient Active Problem List   Diagnosis Date Noted  . Cholelithiasis affecting pregnancy in third trimester, antepartum 07/08/2017  . Acute gallstone pancreatitis 07/08/2017  . No prenatal care in current pregnancy in third trimester 07/08/2017  . History of gestational diabetes mellitus (GDM) 07/08/2017  . History of macrosomia in infant in prior pregnancy, currently pregnant 07/08/2017  . Elevated LFTs     Overnight/24hr events:  none  Subjective:  Pt feeling much better. No GI s/s currently or decreased fetal movements or preterm labor s/s  Objective:    Current Vital Signs 24h Vital Sign Ranges  T 98.3 F (36.8 C) Temp  Avg: 98.1 F (36.7 C)  Min: 97.7 F (36.5 C)  Max: 98.6 F (37 C)  BP 114/67 BP  Min: 108/51  Max: 118/57  HR 88 Pulse  Avg: 89.4  Min: 87  Max: 92  RR 16 Resp  Avg: 17.6  Min: 16  Max: 18  SaO2 100 % Not Delivered SpO2  Avg: 98.2 %  Min: 97 %  Max: 100 %       24 Hour I/O Current Shift I/O  Time Ins Outs 01/24 0701 - 01/25 0700 In: 3608.8 [P.O.:240; I.V.:3368.8] Out: 3400 [Urine:3400] 01/25 0701 - 01/25 1900 In: 444 [P.O.:444] Out: 1800 [Urine:1800]   OBIX not worrking  Physical exam: General: Well nourished, well developed female in no acute distress. Abdomen: gravid, nttp  Medications: Current Facility-Administered Medications  Medication Dose Route Frequency Provider Last Rate Last Dose  . acetaminophen (TYLENOL) tablet 650 mg  650 mg Oral Q4H PRN Levie HeritageStinson, Jacob J, DO   650 mg at 07/09/17 1642  . calcium carbonate (TUMS - dosed in mg elemental calcium) chewable tablet 400 mg of elemental calcium  2 tablet Oral Q4H PRN Levie HeritageStinson, Jacob J, DO      . docusate sodium (COLACE) capsule 100 mg  100 mg Oral Daily PRN [place],  Ceaser Ebeling, MD      . HYDROmorphone (DILAUDID) injection 0.5 mg  0.5 mg Intravenous Q4H PRN Conan Bowensavis, Kelly M, MD   0.5 mg at 07/10/17 0850  . lactated ringers infusion   Intravenous Continuous Meredith PelGuenther, Paula M, NP 100 mL/hr at 07/09/17 2317 100 mL/hr at 07/09/17 2317  . prenatal multivitamin tablet 1 tablet  1 tablet Oral Q1200 Levie HeritageStinson, Jacob J, DO   1 tablet at 07/09/17 1151  . zolpidem (AMBIEN) tablet 5 mg  5 mg Oral QHS PRN Levie HeritageStinson, Jacob J, DO       Current Outpatient Medications  Medication Sig Dispense Refill  . diphenhydrAMINE (BENADRYL) 25 MG tablet Take 25 mg by mouth every 6 (six) hours as needed for sleep.    Marland Kitchen. HYDROmorphone (DILAUDID) 2 MG tablet Take 1 tablet (2 mg total) by mouth every 6 (six) hours as needed for severe pain. 10 tablet 0    Labs:  Recent Labs  Lab 07/08/17 0220 07/09/17 0603  WBC 12.3* 8.4  HGB 11.8* 8.9*  HCT 36.6 27.7*  PLT 231 195    Recent Labs  Lab 07/08/17 0220 07/09/17 0603  NA 134* 137  K 3.8 3.6  CL 101 109  CO2 21* 19*  BUN 8 6  CREATININE 0.51 0.49  CALCIUM 9.6 8.3*  PROT 7.3 5.6*  BILITOT 2.1* 1.6*  ALKPHOS 178*  119  ALT 42 31  AST 64* 27  GLUCOSE 140* 92   Amylase 1/23: 1215  Lipase 1/23: 1001 1/24: 908  Pending: a1c, hiv, rubella screen, hepb surf ag   Radiology:  1/23: repeat BPP 10/10 Cephalic, efw 3278gm, 89%, AC >91% AFI 12.6  Assessment & Plan:  Pt doing well *Pregnancy: qday NSTs -pt planning homebirth and she states she was being seen by a midwife; has never had a formal u/s until today.  *GI: GI following and appreciate recs.  *Preterm: no current issues *PPx: scds, oob ad lib *FEN/GI: MIVF, clears. Low fat breakfast in am if does okay today *Dispo: likely tomorrow  Cornelia Copa. MD Attending Center for [facility])

## 2018-10-19 ENCOUNTER — Other Ambulatory Visit: Payer: Self-pay | Admitting: Otolaryngology

## 2018-10-19 DIAGNOSIS — E049 Nontoxic goiter, unspecified: Secondary | ICD-10-CM

## 2019-01-04 ENCOUNTER — Other Ambulatory Visit: Payer: 59

## 2019-08-04 IMAGING — US US MFM OB DETAIL+14 WK
1 series · 14 of 28 positions shown · non-contrast
Comparison: none

[Series 1: us mfm ob detail+14 wk · 91 acquisitions, 14 frames shown]
[im 4/91]
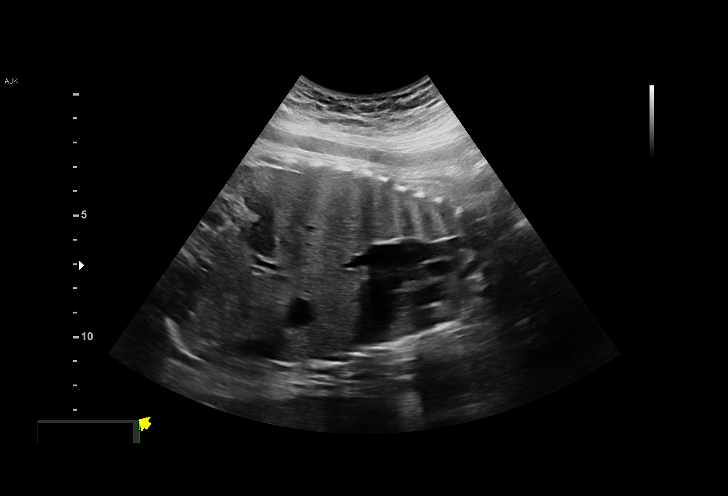
[im 11/91]
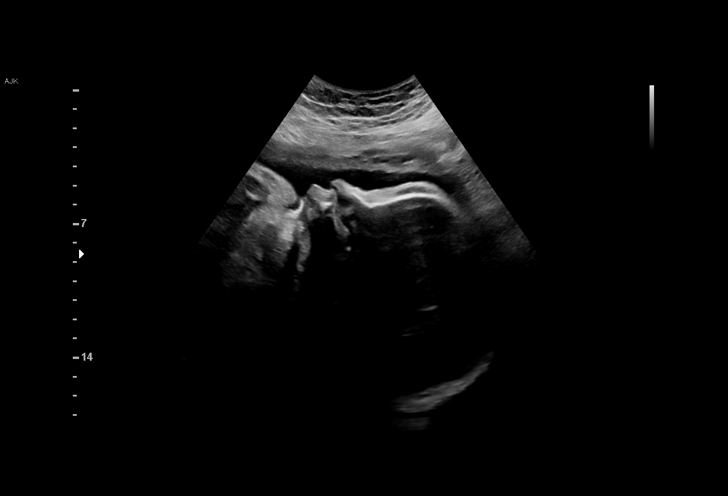
[im 17/91]
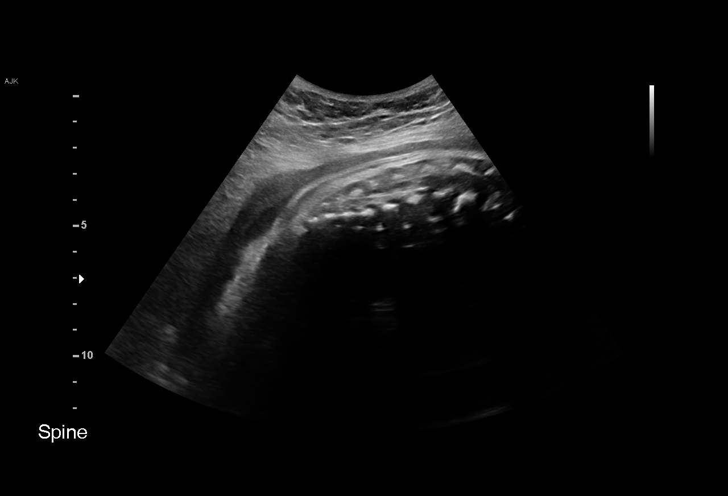
[im 24/91]
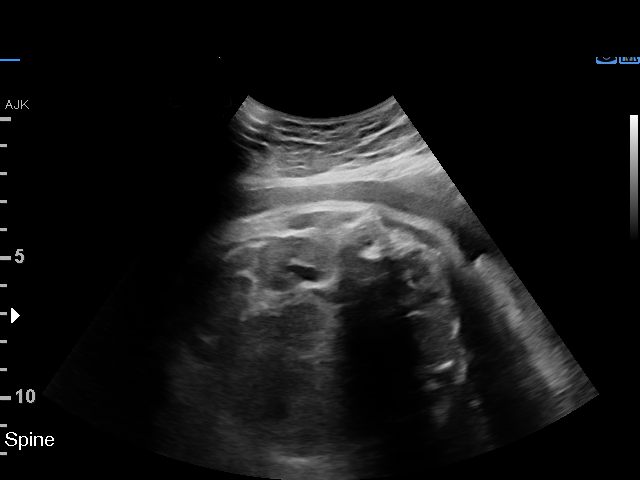
[im 31/91]
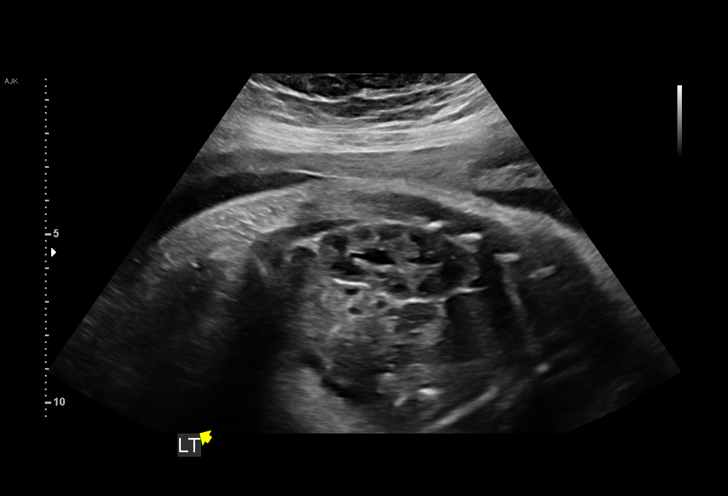
[im 37/91]
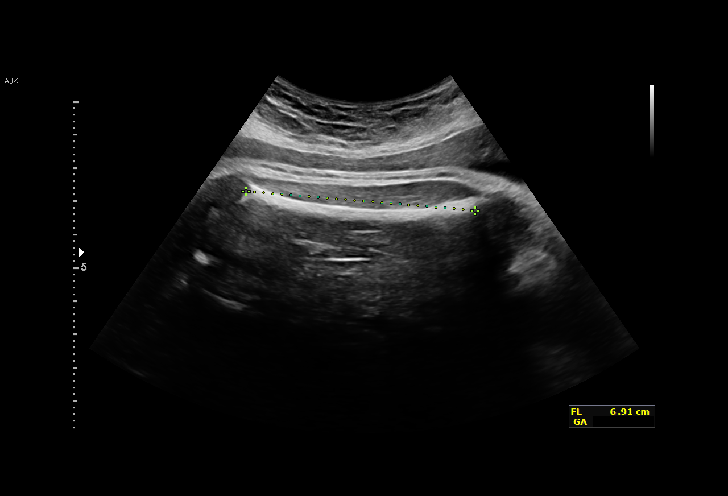
[im 44/91]
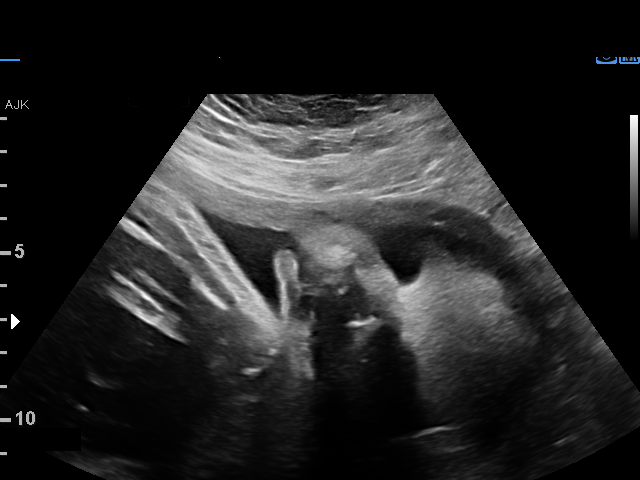
[im 51/91]
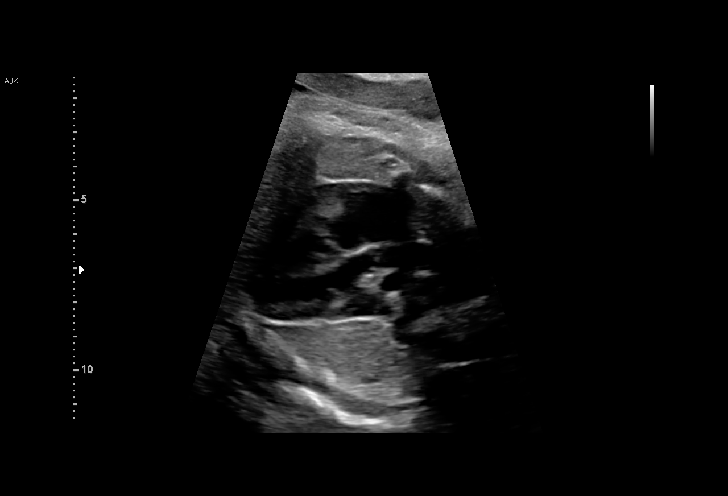
[im 57/91]
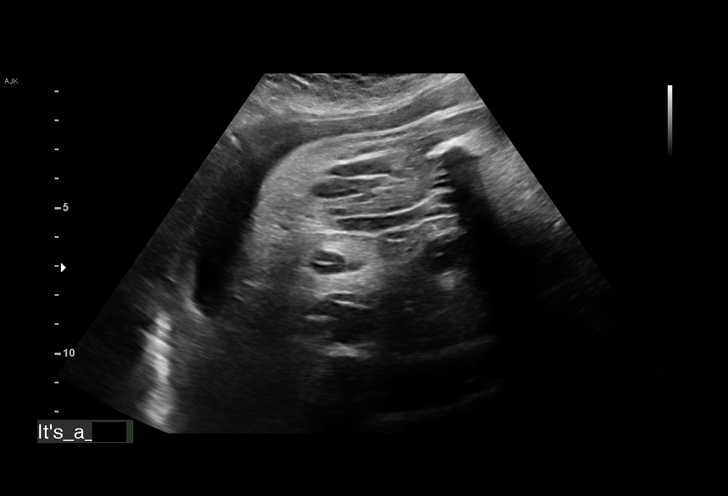
[im 64/91]
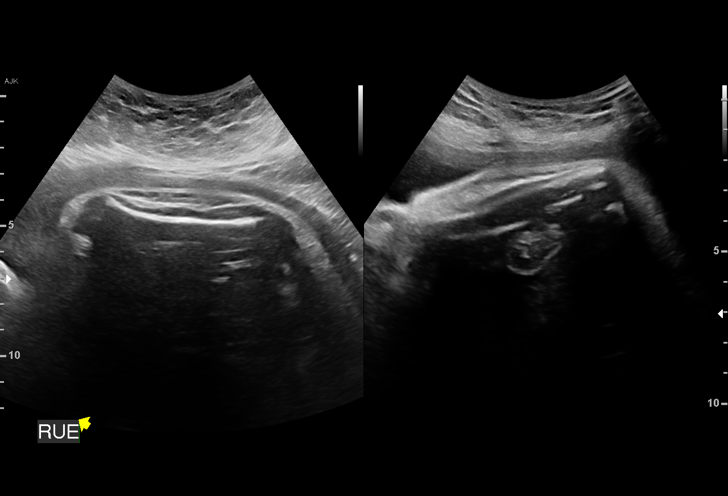
[im 71/91]
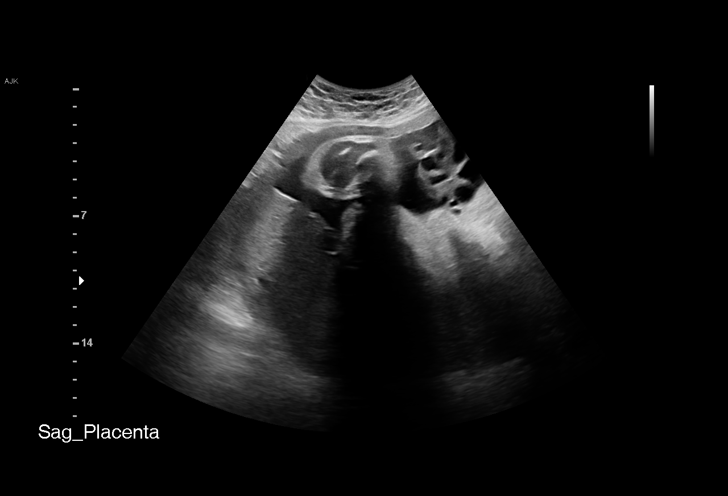
[im 77/91]
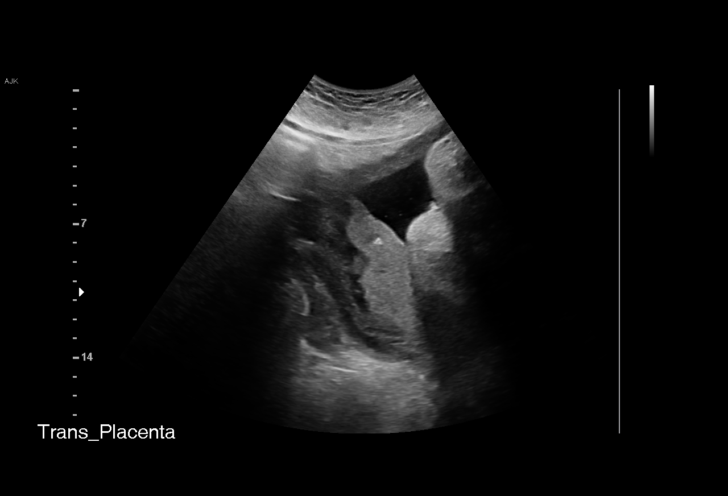
[im 84/91]
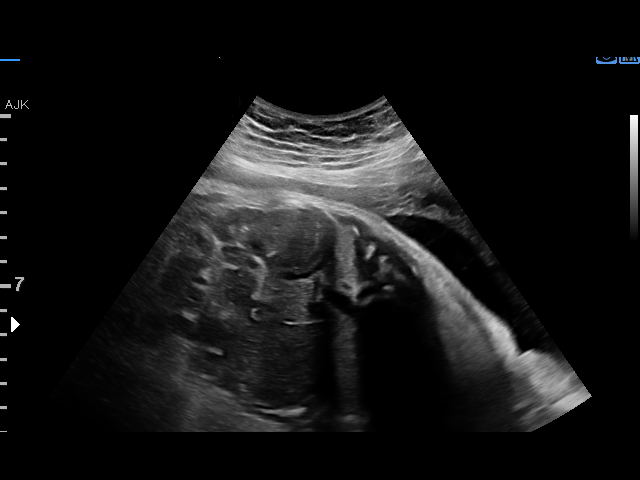
[im 91/91]
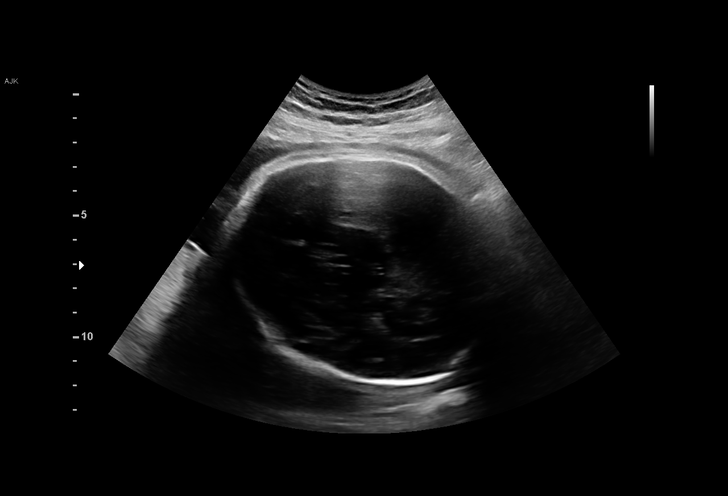

[14 of 28 positions shown; findings below may reference images not displayed]

Attending:        Erdoan Vilardi       Secondary Phy.:   [facility]-
MAU/Triage

2  EGOR CERON          [id number]      [id number]     [id number]
Indications

36 weeks gestation of pregnancy
Insufficient Prenatal Care (no prenatal care,
pt planning a home delivery)
Poor obstetric history: Previous gestational
diabetes
Poor obstetric history: Previous
preeclampsia / eclampsia/gestational HTN
Obesity complicating pregnancy, third
trimester
Encounter for fetal anatomic survey
OB History

Blood Type:            Height:  5'9"   Weight (lb):  242       BMI:
Gravidity:    6         Term:   4        Prem:   0        SAB:   1
TOP:          0       Ectopic:  0        Living: 4
Fetal Evaluation

Num Of Fetuses:     1
Fetal Heart         142
Rate(bpm):
Cardiac Activity:   Observed
Presentation:       Cephalic
Placenta:           Posterior, above cervical os
P. Cord Insertion:  Not well visualized
Amniotic Fluid
AFI FV:      Subjectively within normal limits

AFI Sum(cm)     %Tile       Largest Pocket(cm)
12.63           41

RUQ(cm)       RLQ(cm)       LUQ(cm)        LLQ(cm)
3.53
Biophysical Evaluation

Amniotic F.V:   Within normal limits       F. Tone:        Observed
F. Movement:    Observed                   Score:          [DATE]
F. Breathing:   Observed
Biometry

BPD:      90.9  mm     G. Age:  36w 6d         80  %    CI:         72.3   %    70 - 86
FL/HC:      20.3   %    20.1 -
HC:      340.1  mm     G. Age:  39w 1d         89  %    HC/AC:      0.98        0.93 -
AC:      345.8  mm     G. Age:  38w 3d       > 97  %    FL/BPD:     76.1   %    71 - 87
FL:       69.2  mm     G. Age:  35w 4d         33  %    FL/AC:      20.0   %    20 - 24

Est. FW:    5952  gm      7 lb 4 oz     89  %
Gestational Age

U/S Today:     37w 4d                                        EDD:   07/25/17
Best:          36w 0d     Det. By:  D.O. Conception          EDD:   08/05/17
Anatomy

Cranium:               Appears normal         Aortic Arch:            Not well visualized
Cavum:                 Not well visualized    Ductal Arch:            Not well visualized
Ventricles:            Appears normal         Diaphragm:              Appears normal
Choroid Plexus:        Appears normal         Stomach:                Appears normal, left
sided
Cerebellum:            Not well visualized    Abdomen:                Appears normal
Posterior Fossa:       Not well visualized    Abdominal Wall:         Not well visualized
Nuchal Fold:           Not well visualized    Cord Vessels:           Appears normal (3
vessel cord)
Face:                  Orbits nl; profile not Kidneys:                Appear normal
well visualized
Lips:                  Appears normal         Bladder:                Appears normal
Thoracic:              Appears normal         Spine:                  Ltd views no
intracranial signs of
NT
Heart:                 Appears normal         Upper Extremities:      Not well visualized
(4CH, axis, and
situs)
RVOT:                  Appears normal         Lower Extremities:      Visualized
LVOT:                  Appears normal

Other:  Fetus appears to be a female. Nasal bone visualized. Technicallly
difficult due to advanced GA and maternal habitus.
Impression

SIUP at 86w7d
active fetus
EFW 89th%'le
no defects seen
limited views as above
BPP [DATE]
AFI is normal
Recommendations

Follow up as clinically indicated.  Would recommend
reassessment of interval growth in 3 weeks if undelivered in
the interim.

## 2019-08-04 IMAGING — US US ABDOMEN LIMITED
1 series · 15 of 25 positions shown · non-contrast
Comparison: None.

CLINICAL DATA: Right upper quadrant and epigastric pain. Thirty-six
weeks pregnant.

EXAM:
ULTRASOUND ABDOMEN LIMITED RIGHT UPPER QUADRANT

[Series 1: us abdomen limited · 15 of 40 slices shown]
[im 1/40]
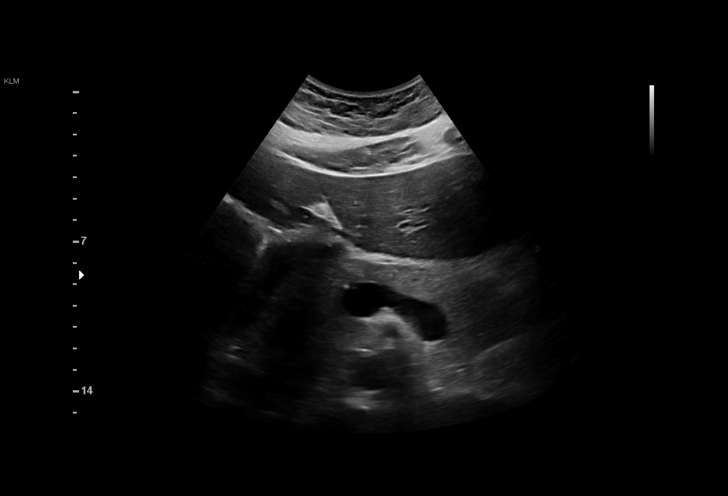
[im 4/40]
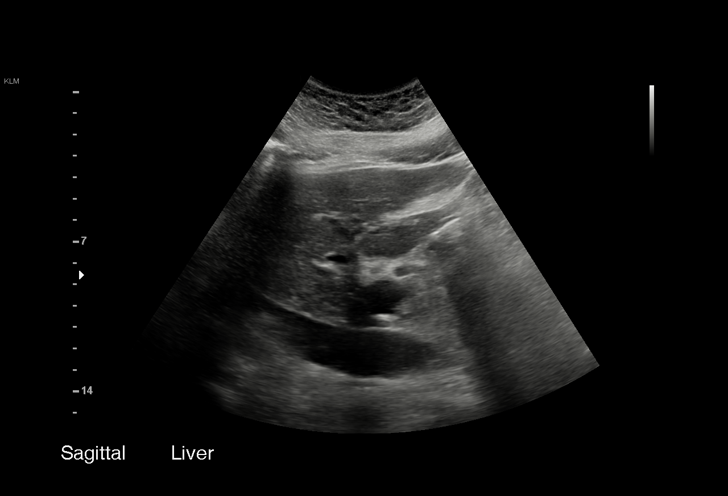
[im 7/40]
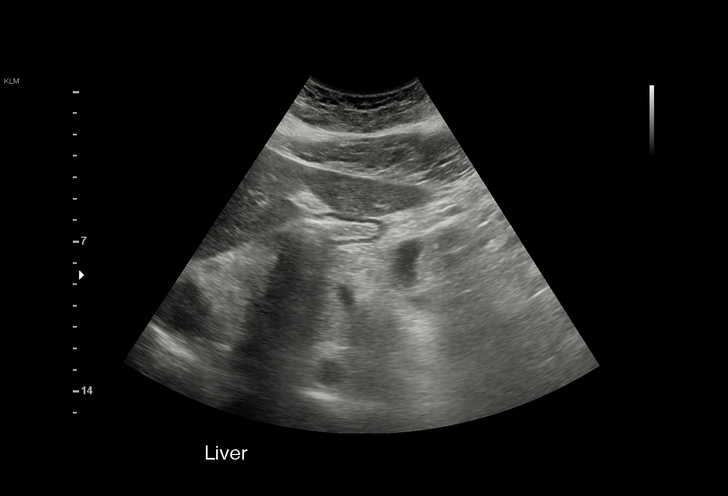
[im 9/40]
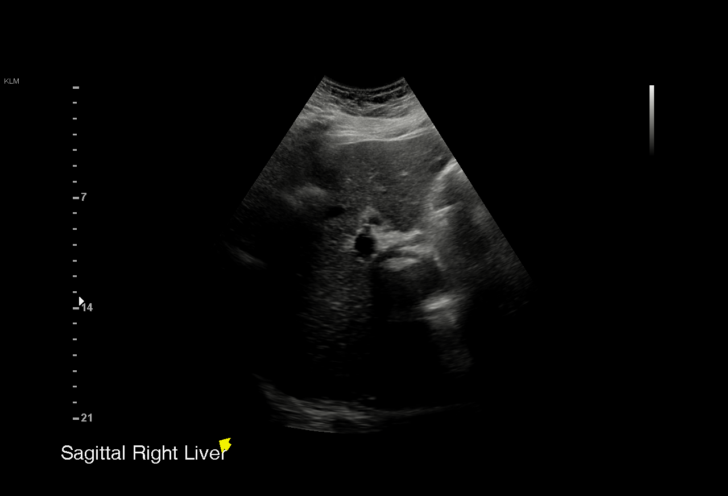
[im 12/40]
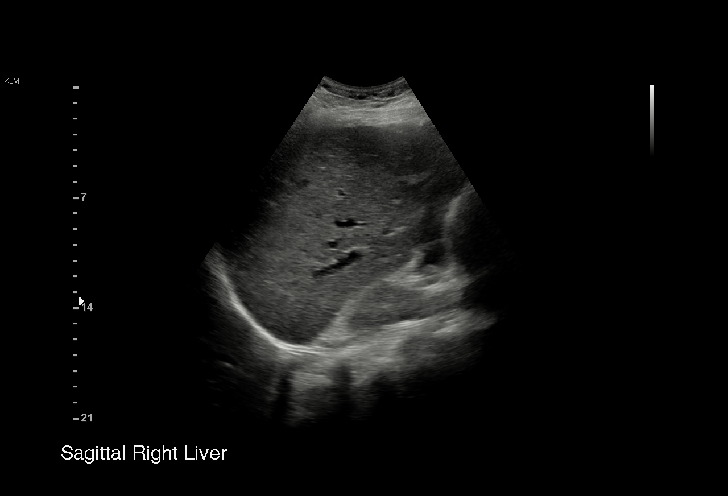
[im 15/40]
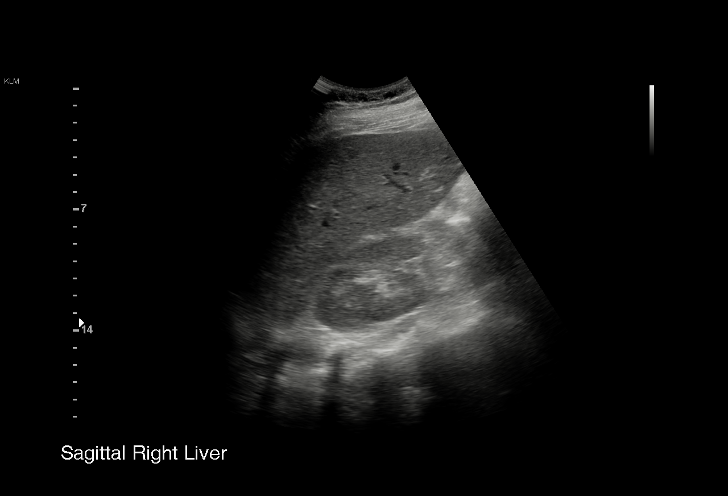
[im 17/40]
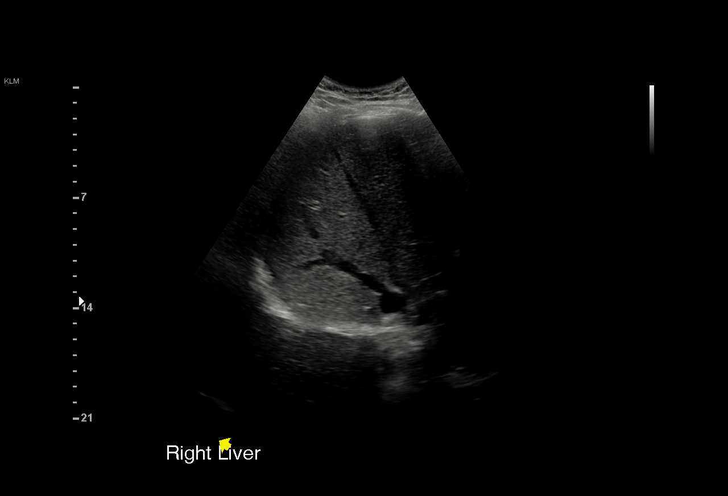
[im 20/40]
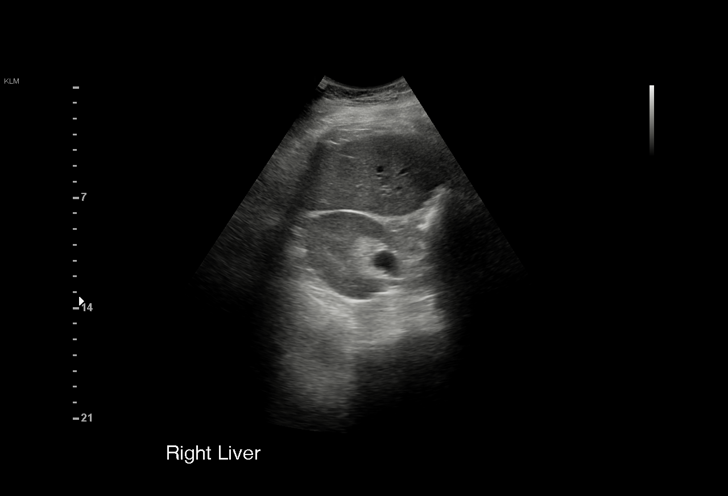
[im 23/40]
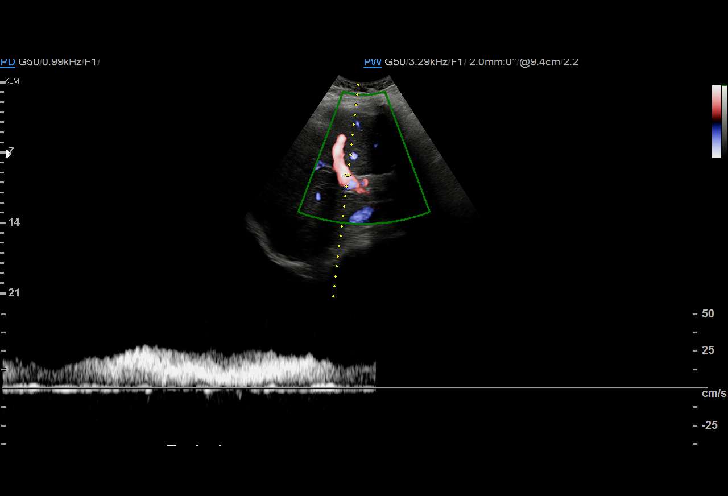
[im 25/40]
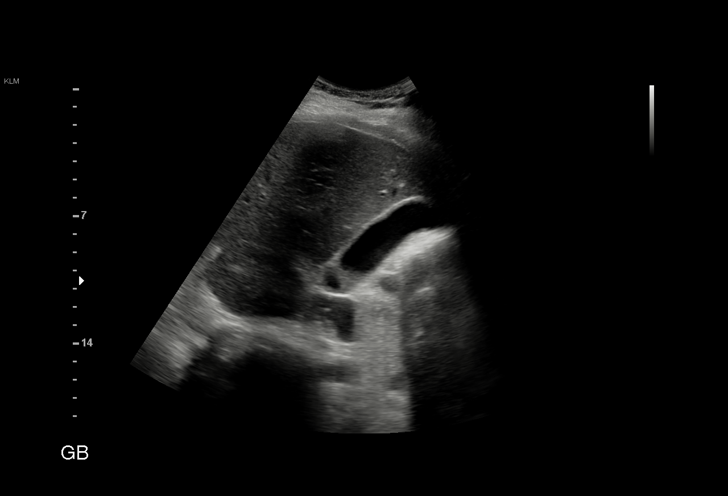
[im 28/40]
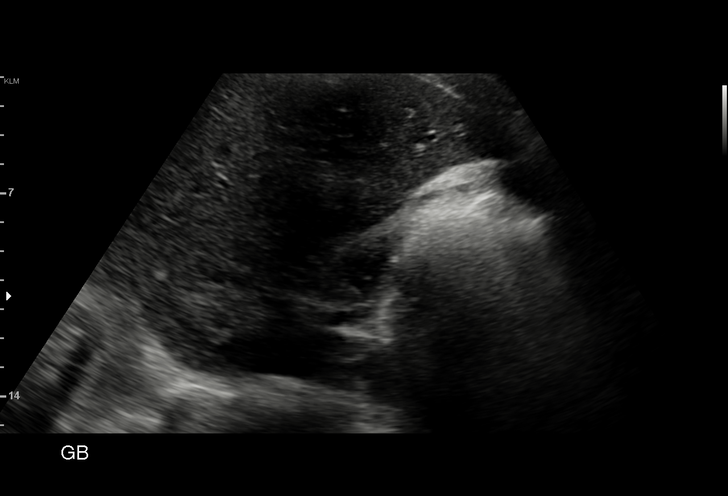
[im 31/40]
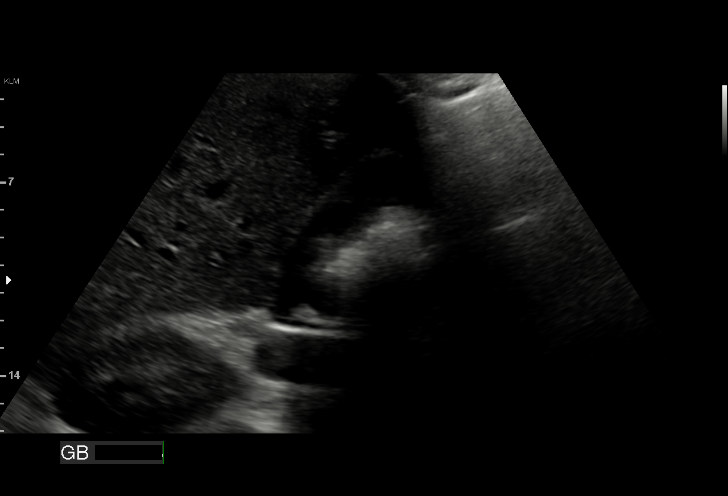
[im 33/40]
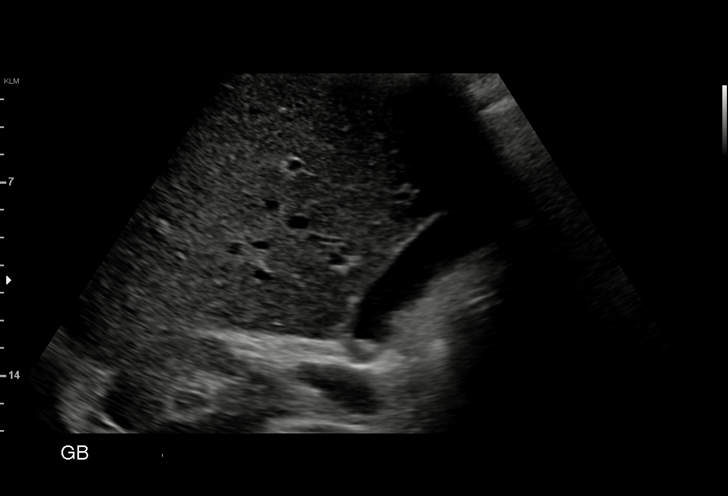
[im 36/40]
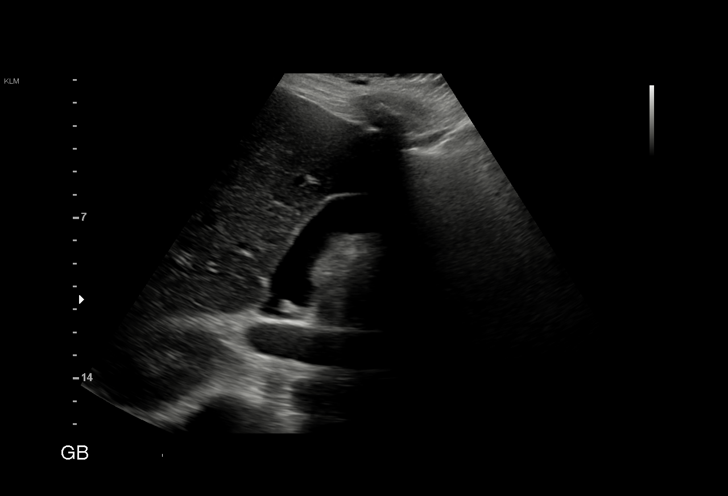
[im 40/40]
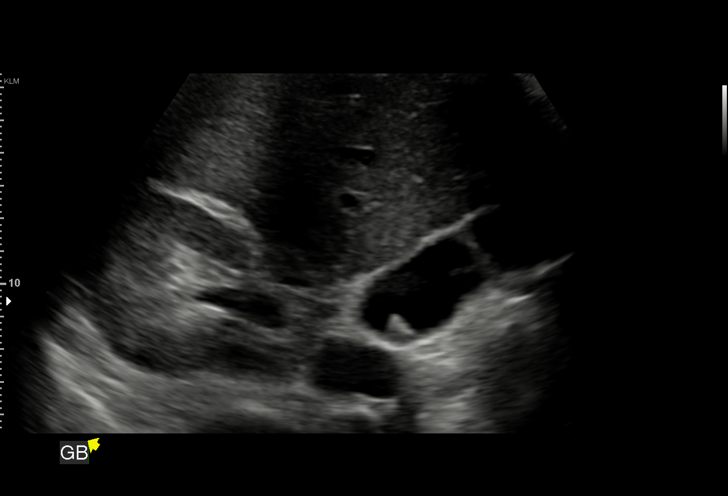

[15 of 25 positions shown; findings below may reference images not displayed]

FINDINGS: Gallbladder:

Cholelithiasis with small stones demonstrated in the gallbladder.
Largest measures 8 mm diameter. Mild gallbladder wall thickening at
4.5 mm. No edema. Murphy's sign is not indicated.

Common bile duct:

Diameter: 2.7 mm, normal

Liver:

No focal lesion identified. Within normal limits in parenchymal
echogenicity. Portal vein is patent on color Doppler imaging with
normal direction of blood flow towards the liver.
IMPRESSION: Cholelithiasis with mild gallbladder wall thickening. No definite
evidence of cholecystitis.

## 2019-11-16 ENCOUNTER — Other Ambulatory Visit: Payer: Self-pay | Admitting: Otolaryngology

## 2019-11-16 ENCOUNTER — Ambulatory Visit
Admission: RE | Admit: 2019-11-16 | Discharge: 2019-11-16 | Disposition: A | Discharge status: Home / Self Care | Payer: 59 | Source: Ambulatory Visit | Attending: Otolaryngology | Admitting: Otolaryngology

## 2019-11-16 DIAGNOSIS — E049 Nontoxic goiter, unspecified: Secondary | ICD-10-CM

## 2021-12-12 IMAGING — US US THYROID
1 series · 13 of 25 positions shown · non-contrast
Comparison: None.

CLINICAL DATA: Thyroid enlargement.

EXAM:
THYROID ULTRASOUND
TECHNIQUE: Ultrasound examination of the thyroid gland and adjacent soft
tissues was performed.

[Series 1: us thyroid · 0.07mm/px · 13 of 34 slices shown]
[im 1/34]
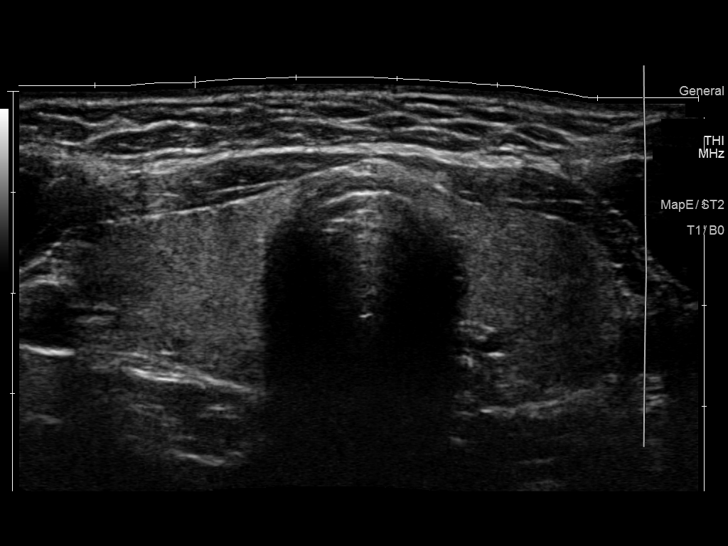
[im 3/34]
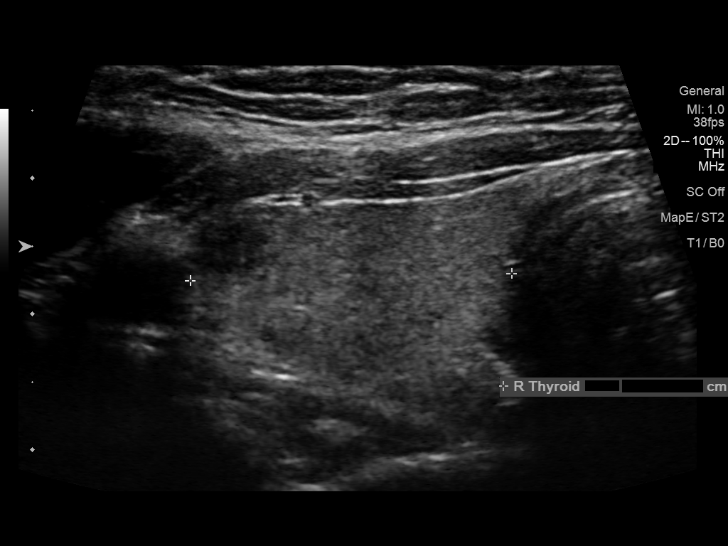
[im 6/34]
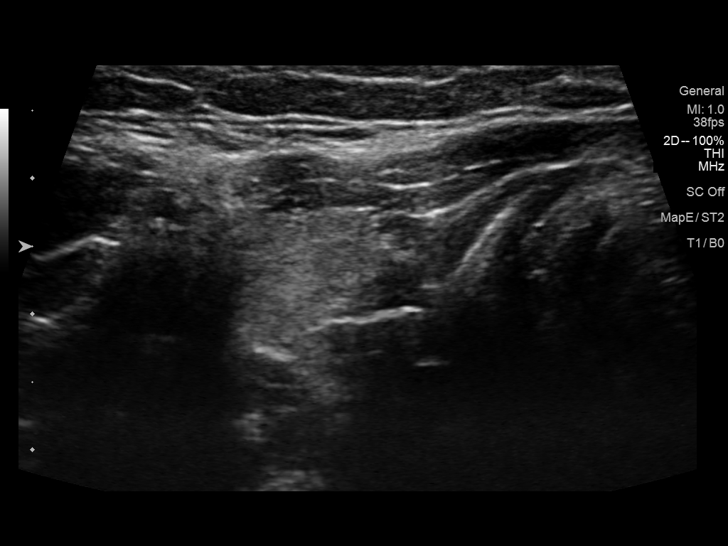
[im 9/34]
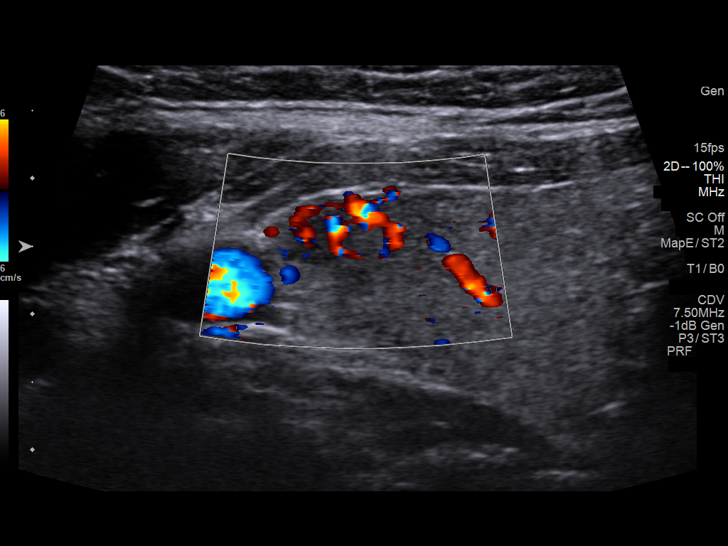
[im 12/34]
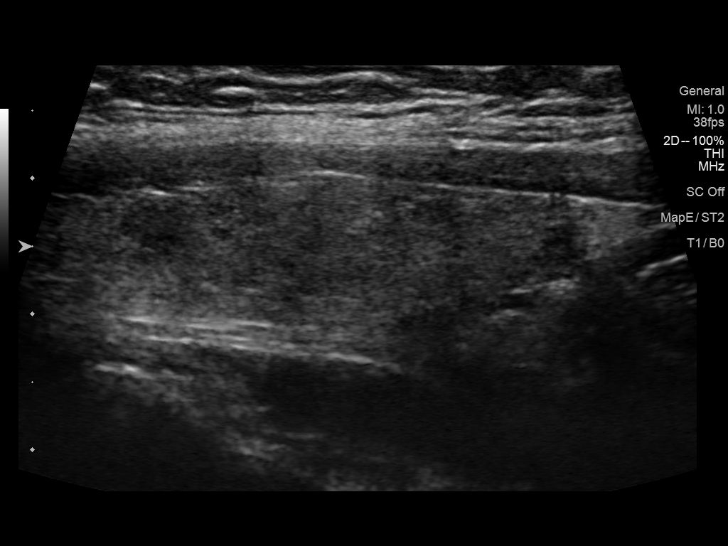
[im 14/34]
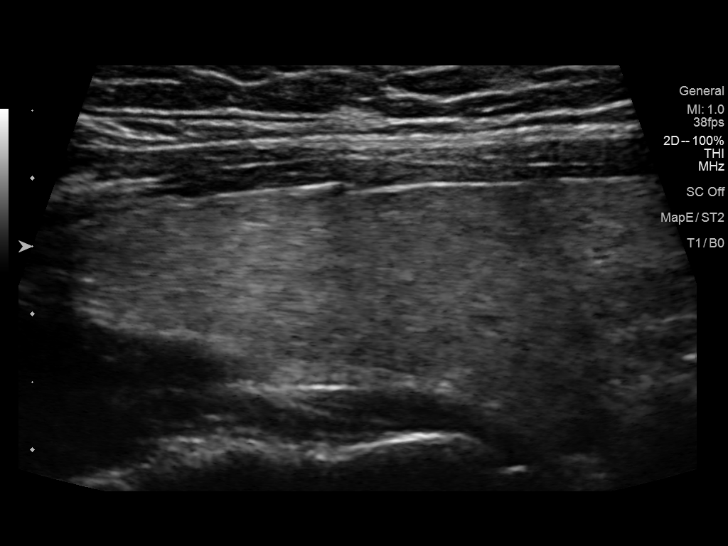
[im 17/34]
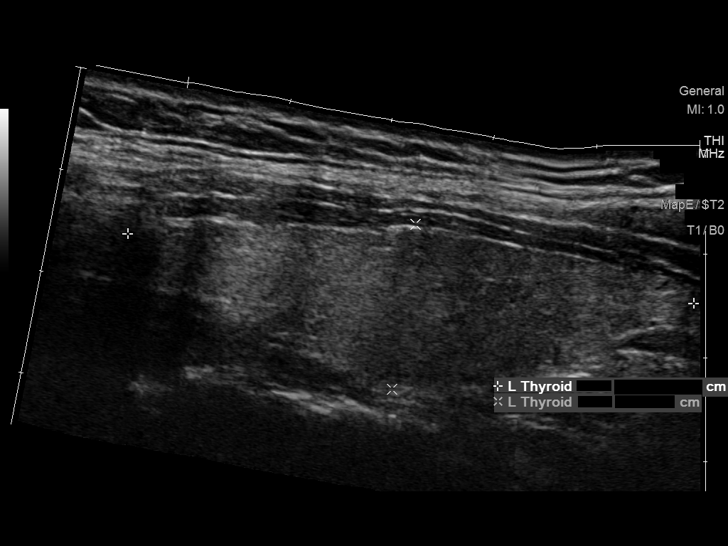
[im 20/34]
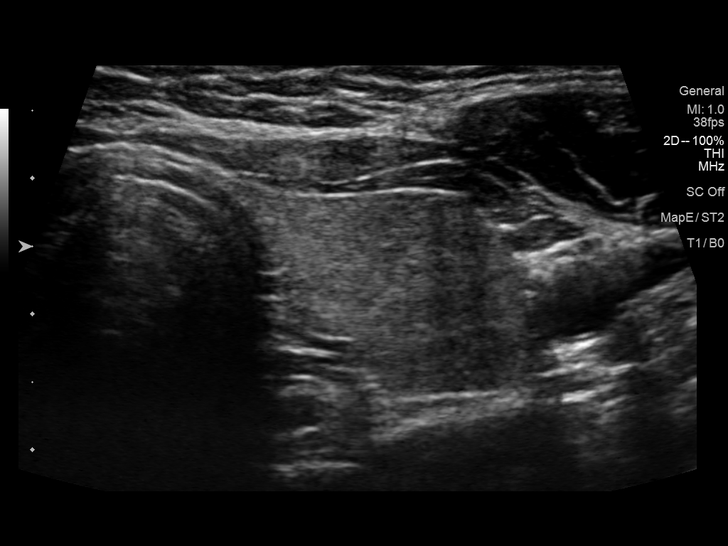
[im 23/34]
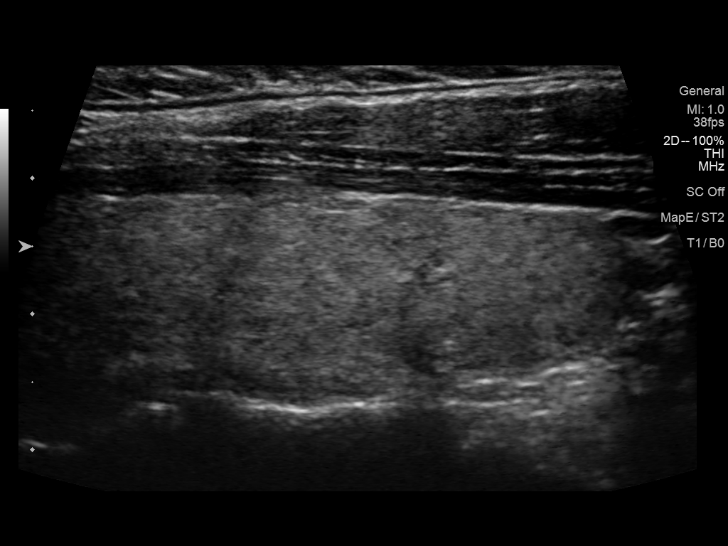
[im 25/34]
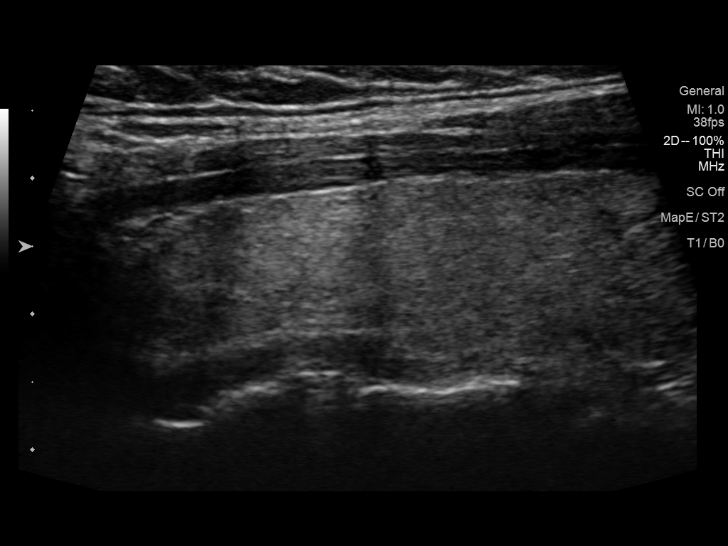
[im 28/34]
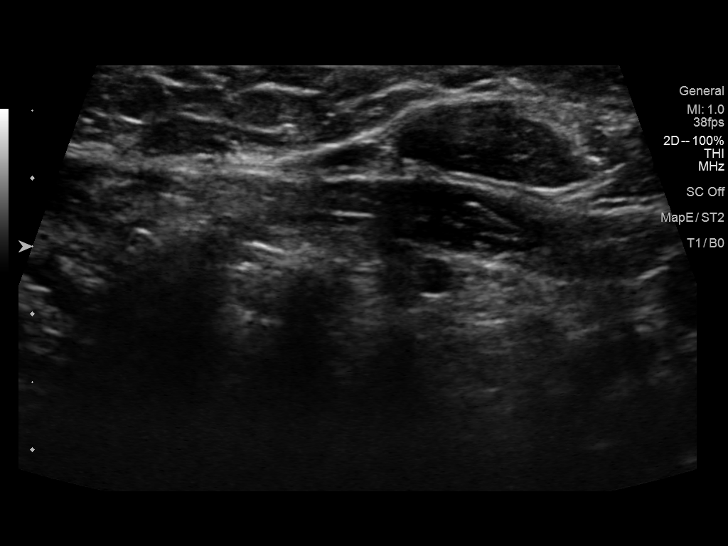
[im 31/34]
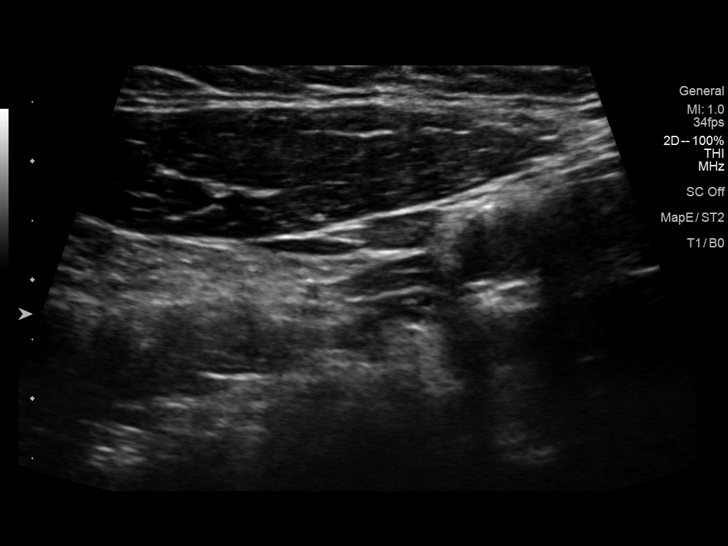
[im 34/34]
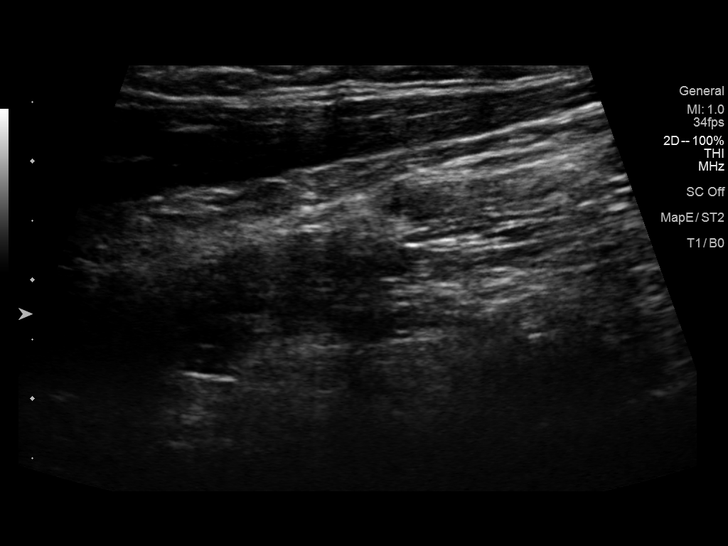

[13 of 25 positions shown; findings below may reference images not displayed]

FINDINGS: Parenchymal Echotexture: Mildly heterogenous

Isthmus: 0.4 cm

Right lobe: 5.4 x 1.6 x 2.4 cm

Left lobe: 5.5 x 1.6 x 1.8 cm

_________________________________________________________

Estimated total number of nodules >/= 1 cm: 0

Number of spongiform nodules >/=  2 cm not described below (TR1): 0

Number of mixed cystic and solid nodules >/= 1.5 cm not described
below (TR2): 0

_________________________________________________________

Nodule # 1:

Location: Right; Mid

Maximum size: 0.7 cm; Other 2 dimensions: 0.5 x 0.5 cm

Composition: cannot determine (2)

Echogenicity: hypoechoic (2)

Shape: not taller-than-wide (0)

Margins: ill-defined (0)

Echogenic foci: punctate echogenic foci (3); questionable

ACR TI-RADS total points: 7.

ACR TI-RADS risk category: TR5 (>/= 7 points).

ACR TI-RADS recommendations:

*Given size (>/= 0.5 - 0.9 cm) and appearance, a follow-up
ultrasound in 1 year should be considered based on TI-RADS criteria.

_________________________________________________________

No discrete left thyroid nodule.
IMPRESSION: Indeterminate small nodule in the right thyroid lobe measuring up to
0.7 cm. Possible echogenic foci in this nodule. This nodule meets
criteria for 1 year follow-up. No other discrete thyroid nodules.

The above is in keeping with the ACR TI-RADS recommendations - [HOSPITAL] 4436;[DATE].

## 2022-08-01 ENCOUNTER — Ambulatory Visit (INDEPENDENT_AMBULATORY_CARE_PROVIDER_SITE_OTHER): Payer: 59 | Admitting: Family Medicine

## 2022-08-01 ENCOUNTER — Encounter: Payer: Self-pay | Admitting: Family Medicine

## 2022-08-01 VITALS — BP 135/82 | HR 105 | Temp 98.5°F | Ht 68.6 in | Wt 251.4 lb

## 2022-08-01 DIAGNOSIS — F428 Other obsessive-compulsive disorder: Secondary | ICD-10-CM

## 2022-08-01 DIAGNOSIS — Z8632 Personal history of gestational diabetes: Secondary | ICD-10-CM | POA: Diagnosis not present

## 2022-08-01 DIAGNOSIS — Z6837 Body mass index (BMI) 37.0-37.9, adult: Secondary | ICD-10-CM | POA: Insufficient documentation

## 2022-08-01 DIAGNOSIS — F411 Generalized anxiety disorder: Secondary | ICD-10-CM | POA: Diagnosis not present

## 2022-08-01 MED ORDER — FLUOXETINE HCL 20 MG PO CAPS: 20.0000 mg | ORAL_CAPSULE | Freq: Every day | ORAL | 3 refills | Status: DC

## 2022-08-01 NOTE — Progress Notes (Signed)
Subjective:  Patient ID: Bonnie Roth, female    DOB: [birth year], 37 y.o.   MRN: [id number]  Patient Care Team: Baruch Gouty, FNP as PCP - General (Family Medicine)   Chief Complaint:  New Patient (Initial Visit) [facility] ), Establish Care, Anxiety, and Panic Attack (X 2 years but has been getting worse )   HPI: Bonnie Roth is a 37 y.o. female presenting on 08/01/2022 for New Patient (Initial Visit) ([place] ), Establish Care, Anxiety, and Panic Attack (X 2 years but has been getting worse )   Pt presents today to establish care with new PCP and for evaluation of anxiety. Bonnie Roth has not been seen by a PCP in several years. Bonnie Roth has 6 children and states the las two were born at home with a midwife/doula. Bonnie Roth does have a history of gestational diabetes with only one of her pregnancies. Not other significant medical history. Bonnie Roth is not on any medications.  Bonnie Roth states over the last 2 years Bonnie Roth has had significant obsessive thoughts, anxiety when things are not the way Bonnie Roth prefers them, anxiety when Bonnie Roth is not in control, near panic at times, and excessive worry. States this started off with minimal symptoms but has increased to severe symptoms which are starting to interfere with her family life and ability to go places due to worsening symptoms.   Anxiety Presents for initial visit. Episode onset: 2 years ago. The problem has been gradually worsening. Symptoms include compulsions, decreased concentration, depressed mood, excessive worry, hyperventilation, insomnia, irritability, muscle tension, nervous/anxious behavior, obsessions, palpitations and panic. Patient reports no chest pain, confusion, dizziness, dry mouth, feeling of choking, impotence, malaise, nausea, restlessness, shortness of breath or suicidal ideas. Symptoms occur constantly. The severity of symptoms is causing significant distress. The symptoms are aggravated by family issues, social activities and specific phobias.    Risk factors include family history. Past treatments include nothing.      08/01/2022   10:08 PM  GAD 7 : Generalized Anxiety Score  Nervous, Anxious, on Edge 3  Control/stop worrying 3  Worry too much - different things 3  Trouble relaxing 2  Restless 0  Easily annoyed or irritable 3  Afraid - awful might happen 3  Total GAD 7 Score 17       08/01/2022   10:07 PM  Depression screen PHQ 2/9  Decreased Interest 1  Down, Depressed, Hopeless 1  PHQ - 2 Score 2  Altered sleeping 1  Tired, decreased energy 1  Change in appetite 0  Feeling bad or failure about yourself  0  Trouble concentrating 1  Moving slowly or fidgety/restless 0  Suicidal thoughts 0  PHQ-9 Score 5      Relevant past medical, surgical, family, and social history reviewed and updated as indicated.  Allergies and medications reviewed and updated. Data reviewed: Chart in Epic.   Past Medical History:  Diagnosis Date   Gestational diabetes    Hx of varicella     Past Surgical History:  Procedure Laterality Date   arthroscopic knee surgery  2001   Left   WISDOM TOOTH EXTRACTION      Social History   Socioeconomic History   Marital status: Married    Spouse name: Not on file   Number of children: Not on file   Years of education: Not on file   Highest education level: Not on file  Occupational History   Not on file  Tobacco Use   Smoking status:  Never   Smokeless tobacco: Never  Vaping Use   Vaping Use: Never used  Substance and Sexual Activity   Alcohol use: Yes    Comment: occ   Drug use: No   Sexual activity: Yes    Birth control/protection: None  Other Topics Concern   Not on file  Social History Narrative   Not on file   Social Determinants of Health   Financial Resource Strain: Not on file  Food Insecurity: Not on file  Transportation Needs: Not on file  Physical Activity: Not on file  Stress: Not on file  Social Connections: Not on file  Intimate Partner Violence:  Not on file    Outpatient Encounter Medications as of 08/01/2022  Medication Sig   FLUoxetine (PROZAC) 20 MG capsule Take 1 capsule (20 mg total) by mouth daily.   [DISCONTINUED] diphenhydrAMINE (BENADRYL) 25 MG tablet Take 25 mg by mouth every 6 (six) hours as needed for sleep.   [DISCONTINUED] HYDROmorphone (DILAUDID) 2 MG tablet Take 1 tablet (2 mg total) by mouth every 6 (six) hours as needed for severe pain.   No facility-administered encounter medications on file as of 08/01/2022.    No Known Allergies  Review of Systems  Constitutional:  Positive for activity change, appetite change, fatigue and irritability. Negative for chills, diaphoresis, fever and unexpected weight change.  Eyes:  Negative for photophobia and visual disturbance.  Respiratory:  Negative for apnea, cough, choking, chest tightness, shortness of breath, wheezing and stridor.   Cardiovascular:  Positive for palpitations. Negative for chest pain.  Gastrointestinal:  Negative for abdominal pain and nausea.  Genitourinary:  Negative for decreased urine volume, difficulty urinating and impotence.  Neurological:  Negative for dizziness, tremors, seizures, syncope, facial asymmetry, speech difficulty, weakness, light-headedness, numbness and headaches.  Psychiatric/Behavioral:  Positive for agitation, decreased concentration and sleep disturbance. Negative for behavioral problems, confusion, dysphoric mood, hallucinations, self-injury and suicidal ideas. The patient is nervous/anxious and has insomnia. The patient is not hyperactive.   All other systems reviewed and are negative.       Objective:  BP 135/82   Pulse (!) 105   Temp 98.5 F (36.9 C) (Temporal)   Ht 5' 8.6" (1.742 m)   Wt 251 lb 6.4 oz (114 kg)   LMP 07/19/2022 (Approximate)   SpO2 97%   BMI 37.56 kg/m    Wt Readings from Last 3 Encounters:  08/01/22 251 lb 6.4 oz (114 kg)  07/08/17 242 lb (109.8 kg)  07/05/15 238 lb (108 kg)    Physical  Exam Vitals and nursing note reviewed.  Constitutional:      General: Bonnie Roth is not in acute distress.    Appearance: Normal appearance. Bonnie Roth is well-developed and well-groomed. Bonnie Roth is obese. Bonnie Roth is not ill-appearing, toxic-appearing or diaphoretic.  HENT:     Head: Normocephalic and atraumatic.     Jaw: There is normal jaw occlusion.     Right Ear: Hearing normal.     Left Ear: Hearing normal.     Nose: Nose normal.     Mouth/Throat:     Lips: Pink.     Mouth: Mucous membranes are moist.     Pharynx: Uvula midline.  Eyes:     General: Lids are normal.     Conjunctiva/sclera: Conjunctivae normal.     Pupils: Pupils are equal, round, and reactive to light.  Neck:     Trachea: Trachea and phonation normal.  Cardiovascular:     Rate and Rhythm: Normal rate  and regular rhythm.     Chest Wall: PMI is not displaced.     Pulses: Normal pulses.     Heart sounds: Normal heart sounds. No murmur heard.    No friction rub. No gallop.  Pulmonary:     Effort: Pulmonary effort is normal. No respiratory distress.     Breath sounds: Normal breath sounds. No wheezing.  Abdominal:     General: There is no abdominal bruit.     Palpations: There is no hepatomegaly or splenomegaly.  Musculoskeletal:        General: Normal range of motion.     Cervical back: Normal range of motion and neck supple.     Right lower leg: No edema.     Left lower leg: No edema.  Skin:    General: Skin is warm and dry.     Capillary Refill: Capillary refill takes less than 2 seconds.     Coloration: Skin is not cyanotic, jaundiced or pale.     Findings: No rash.  Neurological:     General: No focal deficit present.     Mental Status: Bonnie Roth is alert and oriented to person, place, and time.     Sensory: Sensation is intact.     Motor: Motor function is intact.     Coordination: Coordination is intact.     Gait: Gait is intact.     Deep Tendon Reflexes: Reflexes are normal and symmetric.  Psychiatric:        Attention  and Perception: Attention and perception normal.        Mood and Affect: Mood normal. Affect is tearful.        Speech: Speech normal.        Behavior: Behavior normal. Behavior is cooperative.        Thought Content: Thought content normal.        Cognition and Memory: Cognition and memory normal.        Judgment: Judgment normal.     Results for orders placed or performed during the hospital encounter of 07/08/17  Urinalysis, Routine w reflex microscopic  Result Value Ref Range   Color, Urine YELLOW YELLOW   APPearance CLOUDY (A) CLEAR   Specific Gravity, Urine 1.012 1.005 - 1.030   pH 6.0 5.0 - 8.0   Glucose, UA NEGATIVE NEGATIVE mg/dL   Hgb urine dipstick NEGATIVE NEGATIVE   Bilirubin Urine NEGATIVE NEGATIVE   Ketones, ur 20 (A) NEGATIVE mg/dL   Protein, ur NEGATIVE NEGATIVE mg/dL   Nitrite NEGATIVE NEGATIVE   Leukocytes, UA NEGATIVE NEGATIVE  CBC with Differential/Platelet  Result Value Ref Range   WBC 12.3 (H) 4.0 - 10.5 K/uL   RBC 4.38 3.87 - 5.11 MIL/uL   Hemoglobin 11.8 (L) 12.0 - 15.0 g/dL   HCT 36.6 36.0 - 46.0 %   MCV 83.6 78.0 - 100.0 fL   MCH 26.9 26.0 - 34.0 pg   MCHC 32.2 30.0 - 36.0 g/dL   RDW 15.1 11.5 - 15.5 %   Platelets 231 150 - 400 K/uL   Neutrophils Relative % 87 %   Neutro Abs 10.7 (H) 1.7 - 7.7 K/uL   Lymphocytes Relative 6 %   Lymphs Abs 0.7 0.7 - 4.0 K/uL   Monocytes Relative 7 %   Monocytes Absolute 0.8 0.1 - 1.0 K/uL   Eosinophils Relative 0 %   Eosinophils Absolute 0.0 0.0 - 0.7 K/uL   Basophils Relative 0 %   Basophils Absolute 0.0 0.0 - 0.1 K/uL  Amylase  Result Value Ref Range   Amylase 1,215 (H) 28 - 100 U/L  Lipase, blood  Result Value Ref Range   Lipase 1,001 (H) 11 - 51 U/L  Comprehensive metabolic panel  Result Value Ref Range   Sodium 134 (L) 135 - 145 mmol/L   Potassium 3.8 3.5 - 5.1 mmol/L   Chloride 101 101 - 111 mmol/L   CO2 21 (L) 22 - 32 mmol/L   Glucose, Bld 140 (H) 65 - 99 mg/dL   BUN 8 6 - 20 mg/dL    Creatinine, Ser 0.51 0.44 - 1.00 mg/dL   Calcium 9.6 8.9 - 10.3 mg/dL   Total Protein 7.3 6.5 - 8.1 g/dL   Albumin 3.0 (L) 3.5 - 5.0 g/dL   AST 64 (H) 15 - 41 U/L   ALT 42 14 - 54 U/L   Alkaline Phosphatase 178 (H) 38 - 126 U/L   Total Bilirubin 2.1 (H) 0.3 - 1.2 mg/dL   GFR calc non Af Amer >60 >60 mL/min   GFR calc Af Amer >60 >60 mL/min   Anion gap 12 5 - 15  Protein / creatinine ratio, urine  Result Value Ref Range   Creatinine, Urine 92.00 mg/dL   Total Protein, Urine 12 mg/dL   Protein Creatinine Ratio 0.13 0.00 - 0.15 mg/mg[Cre]  Hepatitis B surface antigen  Result Value Ref Range   Hepatitis B Surface Ag Negative Negative  Rubella screen  Result Value Ref Range   Rubella <0.90 (L) Immune >0.99 index  RPR  Result Value Ref Range   RPR Ser Ql Non Reactive Non Reactive  Hemoglobin A1c  Result Value Ref Range   Hgb A1c MFr Bld 5.4 4.8 - 5.6 %   Mean Plasma Glucose 108.28 mg/dL  HIV antibody (routine testing)  Result Value Ref Range   HIV Screen 4th Generation wRfx Non Reactive Non Reactive  Lipase, blood  Result Value Ref Range   Lipase 908 (H) 11 - 51 U/L  Comprehensive metabolic panel  Result Value Ref Range   Sodium 137 135 - 145 mmol/L   Potassium 3.6 3.5 - 5.1 mmol/L   Chloride 109 101 - 111 mmol/L   CO2 19 (L) 22 - 32 mmol/L   Glucose, Bld 92 65 - 99 mg/dL   BUN 6 6 - 20 mg/dL   Creatinine, Ser 0.49 0.44 - 1.00 mg/dL   Calcium 8.3 (L) 8.9 - 10.3 mg/dL   Total Protein 5.6 (L) 6.5 - 8.1 g/dL   Albumin 2.3 (L) 3.5 - 5.0 g/dL   AST 27 15 - 41 U/L   ALT 31 14 - 54 U/L   Alkaline Phosphatase 119 38 - 126 U/L   Total Bilirubin 1.6 (H) 0.3 - 1.2 mg/dL   GFR calc non Af Amer >60 >60 mL/min   GFR calc Af Amer >60 >60 mL/min   Anion gap 9 5 - 15  CBC  Result Value Ref Range   WBC 8.4 4.0 - 10.5 K/uL   RBC 3.28 (L) 3.87 - 5.11 MIL/uL   Hemoglobin 8.9 (L) 12.0 - 15.0 g/dL   HCT 27.7 (L) 36.0 - 46.0 %   MCV 84.5 78.0 - 100.0 fL   MCH 27.1 26.0 - 34.0 pg    MCHC 32.1 30.0 - 36.0 g/dL   RDW 15.5 11.5 - 15.5 %   Platelets 195 150 - 400 K/uL  Type and screen [place]  Result Value Ref Range   ABO/RH(D) A POS  Antibody Screen NEG    Sample Expiration 07/11/2017   GC/Chlamydia probe amp ([place])not at [facility]  Result Value Ref Range   Chlamydia Negative    Neisseria Gonorrhea Negative        Pertinent labs & imaging results that were available during my care of the patient were reviewed by me and considered in my medical decision making.  Assessment & Plan:  Margarit was seen today for new patient (initial visit), establish care, anxiety and panic attack.  Diagnoses and all orders for this visit:  GAD (generalized anxiety disorder) Obsessive thinking Will check labs for potential underlying causes cush as thyroid disorder, anemia, or electrolyte disturbances. Will start SSRI therapy as symptoms are starting to interfere with family life. Pt to follow up in 2-4 weeks, sooner if warranted, for reevaluation.  -     CMP14+EGFR -     CBC with Differential/Platelet -     Thyroid Panel With TSH -     FLUoxetine (PROZAC) 20 MG capsule; Take 1 capsule (20 mg total) by mouth daily.  BMI 37.0-37.9, adult Labs pending, diet and exercise encouraged.  -     CMP14+EGFR -     CBC with Differential/Platelet -     Thyroid Panel With TSH -     Bayer DCA Hb A1c Waived  History of gestational diabetes mellitus (GDM) No recent labs, will check below.  -     CMP14+EGFR -     Bayer DCA Hb A1c Waived     Continue all other maintenance medications.  Follow up plan: Return in about 4 weeks (around 08/29/2022), or if symptoms worsen or fail to improve, for GAD.   Continue healthy lifestyle choices, including diet (rich in fruits, vegetables, and lean proteins, and low in salt and simple carbohydrates) and exercise (at least 30 minutes of moderate physical activity daily).  Educational handout given for GAD  The above  assessment and management plan was discussed with the patient. The patient verbalized understanding of and has agreed to the management plan. Patient is aware to call the clinic if they develop any new symptoms or if symptoms persist or worsen. Patient is aware when to return to the clinic for a follow-up visit. Patient educated on when it is appropriate to go to the emergency department.   Monia Pouch, FNP-C [facility] [phone]

## 2022-08-02 LAB — CBC WITH DIFFERENTIAL/PLATELET
Basophils Absolute: 0 10*3/uL (ref 0.0–0.2)
Basos: 0 %
EOS (ABSOLUTE): 0.2 10*3/uL (ref 0.0–0.4)
Eos: 3 %
Hematocrit: 39.1 % (ref 34.0–46.6)
Hemoglobin: 13.3 g/dL (ref 11.1–15.9)
Immature Grans (Abs): 0 10*3/uL (ref 0.0–0.1)
Immature Granulocytes: 0 %
Lymphocytes Absolute: 1.3 10*3/uL (ref 0.7–3.1)
Lymphs: 25 %
MCH: 28.6 pg (ref 26.6–33.0)
MCHC: 34 g/dL (ref 31.5–35.7)
MCV: 84 fL (ref 79–97)
Monocytes Absolute: 0.3 10*3/uL (ref 0.1–0.9)
Monocytes: 5 %
Neutrophils Absolute: 3.5 10*3/uL (ref 1.4–7.0)
Neutrophils: 67 %
Platelets: 313 10*3/uL (ref 150–450)
RBC: 4.65 x10E6/uL (ref 3.77–5.28)
RDW: 13 % (ref 11.7–15.4)
WBC: 5.3 10*3/uL (ref 3.4–10.8)

## 2022-08-02 LAB — CMP14+EGFR
ALT: 17 IU/L (ref 0–32)
AST: 16 IU/L (ref 0–40)
Albumin/Globulin Ratio: 2 (ref 1.2–2.2)
Albumin: 4.6 g/dL (ref 3.9–4.9)
Alkaline Phosphatase: 74 IU/L (ref 44–121)
BUN/Creatinine Ratio: 15 (ref 9–23)
BUN: 13 mg/dL (ref 6–20)
Bilirubin Total: 1.3 mg/dL — ABNORMAL HIGH (ref 0.0–1.2)
CO2: 23 mmol/L (ref 20–29)
Calcium: 9.2 mg/dL (ref 8.7–10.2)
Chloride: 99 mmol/L (ref 96–106)
Creatinine, Ser: 0.84 mg/dL (ref 0.57–1.00)
Globulin, Total: 2.3 g/dL (ref 1.5–4.5)
Glucose: 88 mg/dL (ref 70–99)
Potassium: 4.5 mmol/L (ref 3.5–5.2)
Sodium: 138 mmol/L (ref 134–144)
Total Protein: 6.9 g/dL (ref 6.0–8.5)
eGFR: 91 mL/min/{1.73_m2} (ref >59)

## 2022-08-02 LAB — THYROID PANEL WITH TSH
Free Thyroxine Index: 2.4 (ref 1.2–4.9)
T3 Uptake Ratio: 30 % (ref 24–39)
T4, Total: 8 ug/dL (ref 4.5–12.0)
TSH: 0.554 u[IU]/mL (ref 0.450–4.500)

## 2022-08-04 LAB — BAYER DCA HB A1C WAIVED: HB A1C (BAYER DCA - WAIVED): 6 % — ABNORMAL HIGH (ref 4.8–5.6)

## 2022-09-03 ENCOUNTER — Ambulatory Visit (INDEPENDENT_AMBULATORY_CARE_PROVIDER_SITE_OTHER): Payer: 59 | Admitting: Family Medicine

## 2022-09-03 ENCOUNTER — Encounter: Payer: Self-pay | Admitting: Family Medicine

## 2022-09-03 VITALS — BP 130/74 | HR 81 | Temp 98.5°F | Ht 68.6 in | Wt 249.8 lb

## 2022-09-03 DIAGNOSIS — F411 Generalized anxiety disorder: Secondary | ICD-10-CM

## 2022-09-03 DIAGNOSIS — F428 Other obsessive-compulsive disorder: Secondary | ICD-10-CM

## 2022-09-03 NOTE — Progress Notes (Signed)
Subjective:  Patient ID: Bonnie Roth, female    DOB: 30-Dec-1985, 37 y.o.   MRN: [id number]  Patient Care Team: Baruch Gouty, FNP as PCP - General (Family Medicine)   Chief Complaint:  Anxiety (4 week follow up- patient states she is doing better. )   HPI: Stepahnie Roth is a 37 y.o. female presenting on 09/03/2022 for Anxiety (4 week follow up- patient states she is doing better. )   Pt following up today after starting SSRI therapy for anxiety and obsessive thinking. Has been taking medications as prescribed and tolerating well. Did have some nausea for the first few days but that has subsided.   Anxiety Presents for follow-up visit. Patient reports no chest pain, compulsions, confusion, decreased concentration, depressed mood, dizziness, dry mouth, excessive worry, feeling of choking, hyperventilation, impotence, insomnia, irritability, malaise, muscle tension, nausea, nervous/anxious behavior, obsessions, palpitations, panic, restlessness, shortness of breath or suicidal ideas. The quality of sleep is good. Nighttime awakenings: none.   Compliance with medications is 76-100%.      09/03/2022    2:05 PM 08/01/2022   10:07 PM  Depression screen PHQ 2/9  Decreased Interest 0 1  Down, Depressed, Hopeless 0 1  PHQ - 2 Score 0 2  Altered sleeping 0 1  Tired, decreased energy 0 1  Change in appetite 0 0  Feeling bad or failure about yourself  0 0  Trouble concentrating 0 1  Moving slowly or fidgety/restless 0 0  Suicidal thoughts 0 0  PHQ-9 Score 0 5  Difficult doing work/chores Not difficult at all       09/03/2022    2:05 PM 08/01/2022   10:08 PM  GAD 7 : Generalized Anxiety Score  Nervous, Anxious, on Edge 0 3  Control/stop worrying 0 3  Worry too much - different things 0 3  Trouble relaxing 0 2  Restless 0 0  Easily annoyed or irritable 0 3  Afraid - awful might happen 0 3  Total GAD 7 Score 0 17  Anxiety Difficulty Not difficult at all       Relevant past  medical, surgical, family, and social history reviewed and updated as indicated.  Allergies and medications reviewed and updated. Data reviewed: Chart in Epic.   Past Medical History:  Diagnosis Date   Gestational diabetes    Hx of varicella     Past Surgical History:  Procedure Laterality Date   arthroscopic knee surgery  2001   Left   WISDOM TOOTH EXTRACTION      Social History   Socioeconomic History   Marital status: Married    Spouse name: Not on file   Number of children: Not on file   Years of education: Not on file   Highest education level: Not on file  Occupational History   Not on file  Tobacco Use   Smoking status: Never   Smokeless tobacco: Never  Vaping Use   Vaping Use: Never used  Substance and Sexual Activity   Alcohol use: Yes    Comment: occ   Drug use: No   Sexual activity: Yes    Birth control/protection: None  Other Topics Concern   Not on file  Social History Narrative   Not on file   Social Determinants of Health   Financial Resource Strain: Not on file  Food Insecurity: Not on file  Transportation Needs: Not on file  Physical Activity: Not on file  Stress: Not on file  Social Connections: Not  on file  Intimate Partner Violence: Not on file    Outpatient Encounter Medications as of 09/03/2022  Medication Sig   FLUoxetine (PROZAC) 20 MG capsule Take 1 capsule (20 mg total) by mouth daily.   No facility-administered encounter medications on file as of 09/03/2022.    No Known Allergies  Review of Systems  Constitutional:  Negative for activity change, appetite change, chills, fatigue, fever and irritability.  HENT: Negative.    Eyes: Negative.   Respiratory:  Negative for cough, chest tightness and shortness of breath.   Cardiovascular:  Negative for chest pain, palpitations and leg swelling.  Gastrointestinal:  Negative for blood in stool, constipation, diarrhea, nausea and vomiting.  Endocrine: Negative.   Genitourinary:   Negative for dysuria, frequency, impotence and urgency.  Musculoskeletal:  Negative for arthralgias and myalgias.  Skin: Negative.   Allergic/Immunologic: Negative.   Neurological:  Negative for dizziness and headaches.  Hematological: Negative.   Psychiatric/Behavioral:  Negative for confusion, decreased concentration, hallucinations, sleep disturbance and suicidal ideas. The patient is not nervous/anxious and does not have insomnia.   All other systems reviewed and are negative.       Objective:  BP 130/74   Pulse 81   Temp 98.5 F (36.9 C) (Temporal)   Ht 5' 8.6" (1.742 m)   Wt 249 lb 12.8 oz (113.3 kg)   SpO2 97%   BMI 37.32 kg/m    Wt Readings from Last 3 Encounters:  09/03/22 249 lb 12.8 oz (113.3 kg)  08/01/22 251 lb 6.4 oz (114 kg)  07/08/17 242 lb (109.8 kg)    Physical Exam Vitals and nursing note reviewed.  Constitutional:      General: She is not in acute distress.    Appearance: Normal appearance. She is obese. She is not ill-appearing, toxic-appearing or diaphoretic.  HENT:     Head: Normocephalic and atraumatic.     Mouth/Throat:     Mouth: Mucous membranes are moist.  Eyes:     Pupils: Pupils are equal, round, and reactive to light.  Cardiovascular:     Rate and Rhythm: Normal rate and regular rhythm.     Heart sounds: Normal heart sounds.  Pulmonary:     Effort: Pulmonary effort is normal.     Breath sounds: Normal breath sounds.  Skin:    General: Skin is warm and dry.     Capillary Refill: Capillary refill takes less than 2 seconds.  Neurological:     General: No focal deficit present.     Mental Status: She is alert and oriented to person, place, and time.  Psychiatric:        Mood and Affect: Mood normal.        Behavior: Behavior normal.        Thought Content: Thought content normal.        Judgment: Judgment normal.     Results for orders placed or performed in visit on 08/01/22  CMP14+EGFR  Result Value Ref Range   Glucose 88 70  - 99 mg/dL   BUN 13 6 - 20 mg/dL   Creatinine, Ser 0.84 0.57 - 1.00 mg/dL   eGFR 91 >59 mL/min/1.73   BUN/Creatinine Ratio 15 9 - 23   Sodium 138 134 - 144 mmol/L   Potassium 4.5 3.5 - 5.2 mmol/L   Chloride 99 96 - 106 mmol/L   CO2 23 20 - 29 mmol/L   Calcium 9.2 8.7 - 10.2 mg/dL   Total Protein 6.9 6.0 - 8.5  g/dL   Albumin 4.6 3.9 - 4.9 g/dL   Globulin, Total 2.3 1.5 - 4.5 g/dL   Albumin/Globulin Ratio 2.0 1.2 - 2.2   Bilirubin Total 1.3 (H) 0.0 - 1.2 mg/dL   Alkaline Phosphatase 74 44 - 121 IU/L   AST 16 0 - 40 IU/L   ALT 17 0 - 32 IU/L  CBC with Differential/Platelet  Result Value Ref Range   WBC 5.3 3.4 - 10.8 x10E3/uL   RBC 4.65 3.77 - 5.28 x10E6/uL   Hemoglobin 13.3 11.1 - 15.9 g/dL   Hematocrit 39.1 34.0 - 46.6 %   MCV 84 79 - 97 fL   MCH 28.6 26.6 - 33.0 pg   MCHC 34.0 31.5 - 35.7 g/dL   RDW 13.0 11.7 - 15.4 %   Platelets 313 150 - 450 x10E3/uL   Neutrophils 67 Not Estab. %   Lymphs 25 Not Estab. %   Monocytes 5 Not Estab. %   Eos 3 Not Estab. %   Basos 0 Not Estab. %   Neutrophils Absolute 3.5 1.4 - 7.0 x10E3/uL   Lymphocytes Absolute 1.3 0.7 - 3.1 x10E3/uL   Monocytes Absolute 0.3 0.1 - 0.9 x10E3/uL   EOS (ABSOLUTE) 0.2 0.0 - 0.4 x10E3/uL   Basophils Absolute 0.0 0.0 - 0.2 x10E3/uL   Immature Granulocytes 0 Not Estab. %   Immature Grans (Abs) 0.0 0.0 - 0.1 x10E3/uL  Thyroid Panel With TSH  Result Value Ref Range   TSH 0.554 0.450 - 4.500 uIU/mL   T4, Total 8.0 4.5 - 12.0 ug/dL   T3 Uptake Ratio 30 24 - 39 %   Free Thyroxine Index 2.4 1.2 - 4.9  Bayer DCA Hb A1c Waived  Result Value Ref Range   HB A1C (BAYER DCA - WAIVED) 6.0 (H) 4.8 - 5.6 %       Pertinent labs & imaging results that were available during my care of the patient were reviewed by me and considered in my medical decision making.  Assessment & Plan:  Lashica was seen today for anxiety.  Diagnoses and all orders for this visit:  GAD (generalized anxiety disorder) Obsessive  thinking Has been on SSRI therapy for the last 4 weeks and feels much better. Will continue therapy.     Continue all other maintenance medications.  Follow up plan: Return in about 6 months (around 03/06/2023), or if symptoms worsen or fail to improve, for CPE.   Continue healthy lifestyle choices, including diet (rich in fruits, vegetables, and lean proteins, and low in salt and simple carbohydrates) and exercise (at least 30 minutes of moderate physical activity daily).  Educational handout given for GAD  The above assessment and management plan was discussed with the patient. The patient verbalized understanding of and has agreed to the management plan. Patient is aware to call the clinic if they develop any new symptoms or if symptoms persist or worsen. Patient is aware when to return to the clinic for a follow-up visit. Patient educated on when it is appropriate to go to the emergency department.   Monia Pouch, FNP-C [facility] [phone]

## 2022-09-04 ENCOUNTER — Encounter: Payer: Self-pay | Admitting: Family Medicine

## 2022-09-05 ENCOUNTER — Ambulatory Visit: Payer: Self-pay | Admitting: Family Medicine

## 2023-02-19 ENCOUNTER — Encounter: Payer: Self-pay | Admitting: Family Medicine

## 2023-03-04 ENCOUNTER — Ambulatory Visit (INDEPENDENT_AMBULATORY_CARE_PROVIDER_SITE_OTHER): Payer: 59 | Admitting: Family Medicine

## 2023-03-04 ENCOUNTER — Encounter: Payer: Self-pay | Admitting: Family Medicine

## 2023-03-04 VITALS — BP 132/76 | HR 91 | Temp 98.1°F | Ht 68.6 in | Wt 257.8 lb

## 2023-03-04 DIAGNOSIS — M79641 Pain in right hand: Secondary | ICD-10-CM | POA: Diagnosis not present

## 2023-03-04 DIAGNOSIS — M79642 Pain in left hand: Secondary | ICD-10-CM

## 2023-03-04 DIAGNOSIS — Z8632 Personal history of gestational diabetes: Secondary | ICD-10-CM | POA: Diagnosis not present

## 2023-03-04 DIAGNOSIS — Z6837 Body mass index (BMI) 37.0-37.9, adult: Secondary | ICD-10-CM | POA: Diagnosis not present

## 2023-03-04 DIAGNOSIS — N926 Irregular menstruation, unspecified: Secondary | ICD-10-CM

## 2023-03-04 LAB — BAYER DCA HB A1C WAIVED: HB A1C (BAYER DCA - WAIVED): 5.5 % (ref 4.8–5.6)

## 2023-03-04 MED ORDER — NAPROXEN 500 MG PO TABS: 500.0000 mg | ORAL_TABLET | Freq: Two times a day (BID) | ORAL | 0 refills | Status: AC

## 2023-03-04 NOTE — Progress Notes (Signed)
Subjective:  Patient ID: Bonnie Roth, female    DOB: 1985/09/08, 37 y.o.   MRN: [id number]  Patient Care Team: Sonny Masters, FNP as PCP - General (Family Medicine)   Chief Complaint:  Annual Exam and Possible Pregnancy   HPI: Bonnie Roth is a 37 y.o. female presenting on 03/04/2023 for Annual Exam and Possible Pregnancy   Pt presents today for annual physical but reports she feels she may be miscarrying. States her LMP was 01/06/2023. She has had positive pregnancy tests at home but they continue to get lighter. She has not had any bleeding or pain but did not with her last miscarriage. She also report bilateral hand pain, right hand pain and left thumb pain. Denies injuries. States this has been ongoing for several months. Has not taken anything for the symptoms at home. No swelling or loss of function. Denies repetitive use.      Relevant past medical, surgical, family, and social history reviewed and updated as indicated.  Allergies and medications reviewed and updated. Data reviewed: Chart in Epic.   Past Medical History:  Diagnosis Date   Gestational diabetes    Hx of varicella     Past Surgical History:  Procedure Laterality Date   arthroscopic knee surgery  2001   Left   WISDOM TOOTH EXTRACTION      Social History   Socioeconomic History   Marital status: Married    Spouse name: Not on file   Number of children: Not on file   Years of education: Not on file   Highest education level: Not on file  Occupational History   Not on file  Tobacco Use   Smoking status: Never   Smokeless tobacco: Never  Vaping Use   Vaping status: Never Used  Substance and Sexual Activity   Alcohol use: Yes    Comment: occ   Drug use: No   Sexual activity: Yes    Birth control/protection: None  Other Topics Concern   Not on file  Social History Narrative   Not on file   Social Determinants of Health   Financial Resource Strain: Not on file  Food Insecurity: Not  on file  Transportation Needs: Not on file  Physical Activity: Not on file  Stress: Not on file  Social Connections: Unknown (10/29/2021)   Received from [facility], [facility]   Social Network    Social Network: Not on file  Intimate Partner Violence: Unknown (09/20/2021)   Received from [facility], [facility]   HITS    Physically Hurt: Not on file    Insult or Talk Down To: Not on file    Threaten Physical Harm: Not on file    Scream or Curse: Not on file    Outpatient Encounter Medications as of 03/04/2023  Medication Sig   naproxen (NAPROSYN) 500 MG tablet Take 1 tablet (500 mg total) by mouth 2 (two) times daily with a meal for 14 days.   [DISCONTINUED] FLUoxetine (PROZAC) 20 MG capsule Take 1 capsule (20 mg total) by mouth daily.   No facility-administered encounter medications on file as of 03/04/2023.    No Known Allergies  Review of Systems  Constitutional:  Negative for activity change, appetite change, chills, diaphoresis, fatigue, fever and unexpected weight change.  HENT: Negative.    Eyes: Negative.   Respiratory:  Negative for cough, chest tightness and shortness of breath.   Cardiovascular:  Negative for chest pain, palpitations and leg swelling.  Gastrointestinal:  Negative for blood in stool, constipation, diarrhea, nausea and vomiting.  Endocrine: Negative.   Genitourinary:  Positive for menstrual problem. Negative for decreased urine volume, difficulty urinating, dyspareunia, dysuria, enuresis, flank pain, frequency, genital sores, hematuria, pelvic pain, urgency, vaginal bleeding, vaginal discharge and vaginal pain.  Musculoskeletal:  Positive for arthralgias. Negative for myalgias.  Skin: Negative.   Allergic/Immunologic: Negative.   Neurological:  Negative for dizziness, tremors, seizures, syncope, facial asymmetry, speech difficulty, weakness, light-headedness, numbness and headaches.  Hematological: Negative.   Psychiatric/Behavioral:  Negative  for confusion, hallucinations, sleep disturbance and suicidal ideas.   All other systems reviewed and are negative.       Objective:  BP 132/76   Pulse 91   Temp 98.1 F (36.7 C) (Temporal)   Ht 5' 8.6" (1.742 m)   Wt 257 lb 12.8 oz (116.9 kg)   LMP 01/06/2023   SpO2 98%   BMI 38.52 kg/m    Wt Readings from Last 3 Encounters:  03/04/23 257 lb 12.8 oz (116.9 kg)  09/03/22 249 lb 12.8 oz (113.3 kg)  08/01/22 251 lb 6.4 oz (114 kg)    Physical Exam Vitals and nursing note reviewed.  Constitutional:      General: She is not in acute distress.    Appearance: Normal appearance. She is obese. She is not ill-appearing, toxic-appearing or diaphoretic.  HENT:     Head: Normocephalic and atraumatic.     Nose: Nose normal.     Mouth/Throat:     Mouth: Mucous membranes are moist.  Eyes:     Conjunctiva/sclera: Conjunctivae normal.     Pupils: Pupils are equal, round, and reactive to light.  Cardiovascular:     Rate and Rhythm: Normal rate and regular rhythm.     Heart sounds: Normal heart sounds.  Pulmonary:     Effort: Pulmonary effort is normal.     Breath sounds: Normal breath sounds.  Musculoskeletal:     Cervical back: Normal range of motion and neck supple.  Skin:    General: Skin is warm and dry.     Capillary Refill: Capillary refill takes less than 2 seconds.  Neurological:     General: No focal deficit present.     Mental Status: She is alert and oriented to person, place, and time.  Psychiatric:        Mood and Affect: Mood normal.        Behavior: Behavior normal.        Thought Content: Thought content normal.        Judgment: Judgment normal.     Results for orders placed or performed in visit on 08/01/22  CMP14+EGFR  Result Value Ref Range   Glucose 88 70 - 99 mg/dL   BUN 13 6 - 20 mg/dL   Creatinine, Ser 1.61 0.57 - 1.00 mg/dL   eGFR 91 >09 UE/AVW/0.98   BUN/Creatinine Ratio 15 9 - 23   Sodium 138 134 - 144 mmol/L   Potassium 4.5 3.5 - 5.2 mmol/L    Chloride 99 96 - 106 mmol/L   CO2 23 20 - 29 mmol/L   Calcium 9.2 8.7 - 10.2 mg/dL   Total Protein 6.9 6.0 - 8.5 g/dL   Albumin 4.6 3.9 - 4.9 g/dL   Globulin, Total 2.3 1.5 - 4.5 g/dL   Albumin/Globulin Ratio 2.0 1.2 - 2.2   Bilirubin Total 1.3 (H) 0.0 - 1.2 mg/dL   Alkaline Phosphatase 74 44 - 121 IU/L   AST 16 0 -  40 IU/L   ALT 17 0 - 32 IU/L  CBC with Differential/Platelet  Result Value Ref Range   WBC 5.3 3.4 - 10.8 x10E3/uL   RBC 4.65 3.77 - 5.28 x10E6/uL   Hemoglobin 13.3 11.1 - 15.9 g/dL   Hematocrit 16.1 09.6 - 46.6 %   MCV 84 79 - 97 fL   MCH 28.6 26.6 - 33.0 pg   MCHC 34.0 31.5 - 35.7 g/dL   RDW 04.5 40.9 - 81.1 %   Platelets 313 150 - 450 x10E3/uL   Neutrophils 67 Not Estab. %   Lymphs 25 Not Estab. %   Monocytes 5 Not Estab. %   Eos 3 Not Estab. %   Basos 0 Not Estab. %   Neutrophils Absolute 3.5 1.4 - 7.0 x10E3/uL   Lymphocytes Absolute 1.3 0.7 - 3.1 x10E3/uL   Monocytes Absolute 0.3 0.1 - 0.9 x10E3/uL   EOS (ABSOLUTE) 0.2 0.0 - 0.4 x10E3/uL   Basophils Absolute 0.0 0.0 - 0.2 x10E3/uL   Immature Granulocytes 0 Not Estab. %   Immature Grans (Abs) 0.0 0.0 - 0.1 x10E3/uL  Thyroid Panel With TSH  Result Value Ref Range   TSH 0.554 0.450 - 4.500 uIU/mL   T4, Total 8.0 4.5 - 12.0 ug/dL   T3 Uptake Ratio 30 24 - 39 %   Free Thyroxine Index 2.4 1.2 - 4.9  Bayer DCA Hb A1c Waived  Result Value Ref Range   HB A1C (BAYER DCA - WAIVED) 6.0 (H) 4.8 - 5.6 %       Pertinent labs & imaging results that were available during my care of the patient were reviewed by me and considered in my medical decision making.  Assessment & Plan:  Bonnie Roth was seen today for annual exam and possible pregnancy.  Diagnoses and all orders for this visit:  Missed menses Will check hCG quant today and again on Monday. Will refer to OB if warranted.  -     CBC with Differential/Platelet -     Beta hCG quant (ref lab) -     Beta hCG quant (ref lab); Future  Bilateral hand pain Will  check for potential RA as this is bilateral and worsens throughout the day. Naproxen twice daily as prescribed. Report new, worsening, or persistent symptoms.  -     CBC with Differential/Platelet -     CMP14+EGFR -     ANA Comprehensive Panel -     naproxen (NAPROSYN) 500 MG tablet; Take 1 tablet (500 mg total) by mouth 2 (two) times daily with a meal for 14 days.  BMI 37.0-37.9, adult Diet and exercise encouraged, labs pending.  -     CBC with Differential/Platelet -     CMP14+EGFR -     Lipid panel -     Thyroid Panel With TSH -     Bayer DCA Hb A1c Waived  History of gestational diabetes mellitus (GDM) Will repeat labs today.  -     CMP14+EGFR -     Thyroid Panel With TSH -     Bayer DCA Hb A1c Waived     Continue all other maintenance medications.  Follow up plan: Return if symptoms worsen or fail to improve.   Continue healthy lifestyle choices, including diet (rich in fruits, vegetables, and lean proteins, and low in salt and simple carbohydrates) and exercise (at least 30 minutes of moderate physical activity daily).   The above assessment and management plan was discussed with the patient. The patient  verbalized understanding of and has agreed to the management plan. Patient is aware to call the clinic if they develop any new symptoms or if symptoms persist or worsen. Patient is aware when to return to the clinic for a follow-up visit. Patient educated on when it is appropriate to go to the emergency department.   Kari Baars, FNP-C [facility] [phone]

## 2023-03-05 LAB — CMP14+EGFR
ALT: 16 IU/L (ref 0–32)
AST: 17 IU/L (ref 0–40)
Albumin: 4.6 g/dL (ref 3.9–4.9)
Alkaline Phosphatase: 65 IU/L (ref 44–121)
BUN/Creatinine Ratio: 11 (ref 9–23)
BUN: 9 mg/dL (ref 6–20)
Bilirubin Total: 0.9 mg/dL (ref 0.0–1.2)
CO2: 22 mmol/L (ref 20–29)
Calcium: 10.2 mg/dL (ref 8.7–10.2)
Chloride: 101 mmol/L (ref 96–106)
Creatinine, Ser: 0.81 mg/dL (ref 0.57–1.00)
Globulin, Total: 2.5 g/dL (ref 1.5–4.5)
Glucose: 90 mg/dL (ref 70–99)
Potassium: 4.3 mmol/L (ref 3.5–5.2)
Sodium: 137 mmol/L (ref 134–144)
Total Protein: 7.1 g/dL (ref 6.0–8.5)
eGFR: 95 mL/min/{1.73_m2} (ref >59)

## 2023-03-05 LAB — CBC WITH DIFFERENTIAL/PLATELET
Basophils Absolute: 0 10*3/uL (ref 0.0–0.2)
Basos: 0 %
EOS (ABSOLUTE): 0.2 10*3/uL (ref 0.0–0.4)
Eos: 2 %
Hematocrit: 38.9 % (ref 34.0–46.6)
Hemoglobin: 12.3 g/dL (ref 11.1–15.9)
Immature Grans (Abs): 0 10*3/uL (ref 0.0–0.1)
Immature Granulocytes: 0 %
Lymphocytes Absolute: 2.2 10*3/uL (ref 0.7–3.1)
Lymphs: 29 %
MCH: 26.7 pg (ref 26.6–33.0)
MCHC: 31.6 g/dL (ref 31.5–35.7)
MCV: 85 fL (ref 79–97)
Monocytes Absolute: 0.5 10*3/uL (ref 0.1–0.9)
Monocytes: 6 %
Neutrophils Absolute: 4.9 10*3/uL (ref 1.4–7.0)
Neutrophils: 63 %
Platelets: 354 10*3/uL (ref 150–450)
RBC: 4.6 x10E6/uL (ref 3.77–5.28)
RDW: 14.5 % (ref 11.7–15.4)
WBC: 7.8 10*3/uL (ref 3.4–10.8)

## 2023-03-05 LAB — THYROID PANEL WITH TSH
Free Thyroxine Index: 2.3 (ref 1.2–4.9)
T3 Uptake Ratio: 28 % (ref 24–39)
T4, Total: 8.3 ug/dL (ref 4.5–12.0)
TSH: 0.61 u[IU]/mL (ref 0.450–4.500)

## 2023-03-05 LAB — ANA COMPREHENSIVE PANEL
Anti JO-1: <0.2 AI (ref 0.0–0.9)
Centromere Ab Screen: <0.2 AI (ref 0.0–0.9)
Chromatin Ab SerPl-aCnc: <0.2 AI (ref 0.0–0.9)
ENA RNP Ab: <0.2 AI (ref 0.0–0.9)
ENA SM Ab Ser-aCnc: <0.2 AI (ref 0.0–0.9)
ENA SSA (RO) Ab: <0.2 AI (ref 0.0–0.9)
ENA SSB (LA) Ab: <0.2 AI (ref 0.0–0.9)
Scleroderma (Scl-70) (ENA) Antibody, IgG: <0.2 AI (ref 0.0–0.9)
dsDNA Ab: <1 IU/mL (ref 0–9)

## 2023-03-05 LAB — LIPID PANEL
Chol/HDL Ratio: 2.5 ratio (ref 0.0–4.4)
Cholesterol, Total: 175 mg/dL (ref 100–199)
HDL: 71 mg/dL (ref >39)
LDL Chol Calc (NIH): 82 mg/dL (ref 0–99)
Triglycerides: 129 mg/dL (ref 0–149)
VLDL Cholesterol Cal: 22 mg/dL (ref 5–40)

## 2023-03-05 LAB — BETA HCG QUANT (REF LAB): hCG Quant: 17053 m[IU]/mL

## 2023-03-09 ENCOUNTER — Other Ambulatory Visit: Payer: Self-pay | Admitting: Family Medicine

## 2023-03-09 DIAGNOSIS — N926 Irregular menstruation, unspecified: Secondary | ICD-10-CM

## 2023-03-10 LAB — BETA HCG QUANT (REF LAB): hCG Quant: 22375 m[IU]/mL

## 2023-03-11 NOTE — Addendum Note (Signed)
Addended by: Sonny Masters on: 03/11/2023 11:00 AM   Modules accepted: Orders

## 2023-03-17 ENCOUNTER — Encounter: Payer: Self-pay | Admitting: Family Medicine

## 2023-04-15 ENCOUNTER — Encounter: Payer: Self-pay | Admitting: Family Medicine

## 2023-04-24 ENCOUNTER — Other Ambulatory Visit: Payer: Self-pay | Admitting: Family Medicine

## 2023-04-24 ENCOUNTER — Encounter: Payer: Self-pay | Admitting: Family Medicine

## 2023-04-24 DIAGNOSIS — F411 Generalized anxiety disorder: Secondary | ICD-10-CM

## 2023-04-24 DIAGNOSIS — F40243 Fear of flying: Secondary | ICD-10-CM

## 2023-04-24 MED ORDER — LORAZEPAM 0.5 MG PO TABS: 0.5000 mg | ORAL_TABLET | Freq: Two times a day (BID) | ORAL | 0 refills | Status: AC | PRN

## 2023-04-24 MED ORDER — FLUOXETINE HCL 20 MG PO CAPS: 20.0000 mg | ORAL_CAPSULE | Freq: Every day | ORAL | 2 refills | Status: AC

## 2023-04-24 NOTE — Progress Notes (Signed)
[place] has upcoming trip [place]. 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 The flight is approaching and will send in medications. PDMP was reviewed and no red flags were noted.

## 2024-03-09 ENCOUNTER — Encounter: Payer: 59 | Admitting: Family Medicine
